# Patient Record
Sex: Female | Born: 1970 | Race: Black or African American | Hispanic: No | Marital: Married | Smoking: Never smoker
Health system: Southern US, Community
[De-identification: ages and names within clinical notes are randomized; demographics above are authoritative.]

## PROBLEM LIST (undated history)

## (undated) DIAGNOSIS — I10 Essential (primary) hypertension: Secondary | ICD-10-CM

---

## 2017-04-12 ENCOUNTER — Ambulatory Visit (INDEPENDENT_AMBULATORY_CARE_PROVIDER_SITE_OTHER): Payer: Medicaid Other | Admitting: Family Medicine

## 2017-04-12 ENCOUNTER — Other Ambulatory Visit (HOSPITAL_COMMUNITY)
Admission: RE | Admit: 2017-04-12 | Discharge: 2017-04-12 | Disposition: A | Discharge status: Home / Self Care | Payer: Medicaid Other | Source: Ambulatory Visit | Attending: Family Medicine | Admitting: Family Medicine

## 2017-04-12 VITALS — BP 122/82 | HR 68 | Temp 98.1°F | Ht 67.5 in | Wt 173.6 lb

## 2017-04-12 DIAGNOSIS — Z0289 Encounter for other administrative examinations: Secondary | ICD-10-CM | POA: Diagnosis not present

## 2017-04-12 DIAGNOSIS — Z789 Other specified health status: Secondary | ICD-10-CM | POA: Diagnosis not present

## 2017-04-12 LAB — POCT URINALYSIS DIP (MANUAL ENTRY)
Bilirubin, UA: NEGATIVE
Glucose, UA: NEGATIVE mg/dL
Leukocytes, UA: NEGATIVE
Nitrite, UA: NEGATIVE
PROTEIN UA: NEGATIVE mg/dL
SPEC GRAV UA: 1.02 (ref 1.010–1.025)
UROBILINOGEN UA: 0.2 U/dL
pH, UA: 5.5 (ref 5.0–8.0)

## 2017-04-12 LAB — POCT UA - MICROSCOPIC ONLY

## 2017-04-12 NOTE — Patient Instructions (Addendum)
Thank you for coming in to see [place] today. Please see below to review our plan for today's visit.  I have placed a referral for you to be seen by the OBGYN.   Please call the clinic at [phone] if your symptoms worsen or you have any concerns. It was my pleasure to see you. -- Durward Parcel, DO [facility], PGY-2

## 2017-04-12 NOTE — Progress Notes (Signed)
Swahili interpreter [facility] utilized during today's visit.  [facility] New Patient Visit  HPI:  Patient presents to [facility] today for a new patient appointment to establish general primary care, also to discuss family planning.  Family planning:  Expecting to be pregnant last month but now having period. She used to take Depo-Provera for contraception in [place]. Has been trying for eight years. Period are regular 28-day cycles lasting 4 days. Does have white vaginal discharge with some itching. ROS: Denies heavy vaginal bleeding, amenorrhea, abdominal pain, pelvic pain.  ROS: Negative otherwise see HPI  Past Medical Hx:  - Hypertension  Past Surgical Hx:  - No  Family Hx: updated in Epic - Number of family members: Husband, 5 sons and 2 daughters - Number of family members in [place]: All live in [place]  Immigrant Social History: - Name spelling correct?: Yes - Date arrived in [place]: 03/30/2017 - Country of origin: [place], came from [place] - Location of refugee camp (if applicable), how long there, and what caused patient to leave home country?:  N/A - Primary language: Swahili  -Requires intepreter (essentially speaks no [place]) - Education: Highest level of education: 5th grade - Prior work: [employer] - Best family contact/phone number [phone] (Mobile), [phone] (Emergency contact, Husband) - Tobacco/alcohol/drug use: no/no/no - Marriage Status: Married - Sexual activity: Yes - Were you beaten or tortured in your country? No  Preventative Care History: -Seen at health department?: No  PHYSICAL EXAM: General: well nourished, well developed, in no acute distress with non-toxic appearance HEENT: normocephalic, atraumatic, moist mucous membranes Neck: supple, non-tender without lymphadenopathy CV: regular rate and rhythm without murmurs, rubs, or gallops Lungs: clear to auscultation bilaterally with normal  work of breathing Abdomen: soft, non-tender, no masses or organomegaly palpable, normoactive bowel sounds Skin: warm, dry, no rashes or lesions, cap refill < 2 seconds Extremities: warm and well perfused, normal tone Psych: euthymic mood, congruent affect  Examined and interviewed with Dr. Gwendolyn Grant

## 2017-04-13 LAB — URINE CYTOLOGY ANCILLARY ONLY
CHLAMYDIA, DNA PROBE: NEGATIVE
NEISSERIA GONORRHEA: NEGATIVE

## 2017-04-13 NOTE — Assessment & Plan Note (Signed)
Patient desires referral to gynecology for discussion on fertility.  Discussed likely entering menopause.  Will only have Medicaid for 8 more months, therefore will place gyn referral today mostly for second opinion and confirmation of difficulties of becoming pregnant

## 2017-04-14 LAB — COMPREHENSIVE METABOLIC PANEL
ALBUMIN: 4.3 g/dL (ref 3.5–5.5)
ALT: 12 IU/L (ref 0–32)
AST: 16 IU/L (ref 0–40)
Albumin/Globulin Ratio: 1.7 (ref 1.2–2.2)
Alkaline Phosphatase: 73 IU/L (ref 39–117)
BUN / CREAT RATIO: 9 (ref 9–23)
BUN: 10 mg/dL (ref 6–24)
CHLORIDE: 107 mmol/L — AB (ref 96–106)
CO2: 22 mmol/L (ref 20–29)
Calcium: 8.6 mg/dL — ABNORMAL LOW (ref 8.7–10.2)
Creatinine, Ser: 1.13 mg/dL — ABNORMAL HIGH (ref 0.57–1.00)
GFR calc non Af Amer: 58 mL/min/{1.73_m2} — ABNORMAL LOW (ref >59)
GFR, EST AFRICAN AMERICAN: 67 mL/min/{1.73_m2} (ref >59)
GLUCOSE: 89 mg/dL (ref 65–99)
Globulin, Total: 2.6 g/dL (ref 1.5–4.5)
Potassium: 3.9 mmol/L (ref 3.5–5.2)
Sodium: 143 mmol/L (ref 134–144)
TOTAL PROTEIN: 6.9 g/dL (ref 6.0–8.5)

## 2017-04-14 LAB — CBC WITH DIFFERENTIAL/PLATELET
BASOS: 0 %
Basophils Absolute: 0 10*3/uL (ref 0.0–0.2)
EOS (ABSOLUTE): 0.2 10*3/uL (ref 0.0–0.4)
EOS: 5 %
HEMATOCRIT: 35.5 % (ref 34.0–46.6)
Hemoglobin: 11 g/dL — ABNORMAL LOW (ref 11.1–15.9)
Immature Grans (Abs): 0 10*3/uL (ref 0.0–0.1)
Immature Granulocytes: 0 %
LYMPHS ABS: 1.9 10*3/uL (ref 0.7–3.1)
Lymphs: 40 %
MCH: 23.2 pg — ABNORMAL LOW (ref 26.6–33.0)
MCHC: 31 g/dL — AB (ref 31.5–35.7)
MCV: 75 fL — AB (ref 79–97)
MONOS ABS: 0.3 10*3/uL (ref 0.1–0.9)
Monocytes: 6 %
Neutrophils Absolute: 2.3 10*3/uL (ref 1.4–7.0)
Neutrophils: 49 %
Platelets: 256 10*3/uL (ref 150–379)
RBC: 4.75 x10E6/uL (ref 3.77–5.28)
RDW: 17.4 % — AB (ref 12.3–15.4)
WBC: 4.8 10*3/uL (ref 3.4–10.8)

## 2017-04-14 LAB — QUANTIFERON TB GOLD ASSAY (BLOOD)

## 2017-04-14 LAB — VARICELLA ZOSTER ANTIBODY, IGG: Varicella zoster IgG: 320 index (ref >165)

## 2017-04-14 LAB — HEPATITIS B SURFACE ANTIGEN: Hepatitis B Surface Ag: NEGATIVE

## 2017-04-14 LAB — RPR: RPR Ser Ql: NONREACTIVE

## 2017-04-14 LAB — SICKLE CELL SCREEN: Sickle Cell Screen: NEGATIVE

## 2017-04-14 LAB — HIV ANTIBODY (ROUTINE TESTING W REFLEX): HIV Screen 4th Generation wRfx: NONREACTIVE

## 2017-04-24 LAB — QUANTIFERON-TB GOLD PLUS
QUANTIFERON NIL VALUE: 0.05 [IU]/mL
QUANTIFERON TB1 AG VALUE: 0.11 [IU]/mL
QuantiFERON Mitogen Value: >10 IU/mL
QuantiFERON TB2 Ag Value: 0.16 IU/mL
QuantiFERON-TB Gold Plus: NEGATIVE

## 2017-04-24 LAB — SPECIMEN STATUS REPORT

## 2017-05-08 NOTE — Progress Notes (Signed)
   Subjective   Patient ID: Monica Li    DOB: 12/28/1970, 46 y.o. female   MRN: [id number]  CC: "High blood pressure"  HPI: Monica Li is a 46 y.o. female who presents to clinic today for the following:  Hypertension: Patient states she was diagnosed 2 years ago with high blood pressure and started on atenolol.  She continue this medication making her transition has refugee here to [place].  She denies history of smoking.  She was instructed to stop the atenolol during her last office visit to reevaluate her BP level.  She does not check her BP level at home.  ROS: see HPI for pertinent.  PMFSH: Refugee. Surgical history unremarkable. Family history unremarkable. Smoking status reviewed. Medications reviewed.  Objective   BP (!) 140/92   Pulse 71   Temp 99.3 F (37.4 C) (Oral)   Ht 5' 7.5" (1.715 m)   Wt 181 lb 6.4 oz (82.3 kg)   LMP 04/27/2017 (Approximate)   SpO2 99%   BMI 27.99 kg/m  Vitals and nursing note reviewed.  General: well nourished, well developed, in no acute distress with non-toxic appearance HEENT: normocephalic, atraumatic, moist mucous membranes Neck: supple, non-tender without lymphadenopathy CV: regular rate and rhythm without murmurs, rubs, or gallops, no lower extremity edema Lungs: clear to auscultation bilaterally with normal work of breathing Skin: warm, dry, no rashes or lesions, cap refill < 2 seconds Extremities: warm and well perfused, normal tone  Assessment & Plan   Primary hypertension Chronic.  Uncontrolled.  Not currently on antihypertensives per my instruction.  Clearly would benefit from medication. -Initiating amlodipine 5 mg daily with instructions to discontinue atenolol -TSH and fasting lipid panel pending  Orders Placed This Encounter  Procedures  . Lipid panel    Standing Status:   Future    Standing Expiration Date:   05/09/2018  . TSH   Meds ordered this encounter  Medications  . amLODipine (NORVASC) 5 MG  tablet    Sig: Take 1 tablet (5 mg total) daily by mouth.    Dispense:  90 tablet    Refill:  3    Durward Parcelavid Pete Merten, DO [facility], PGY-2 05/09/2017 6:56 PM

## 2017-05-09 ENCOUNTER — Encounter: Payer: Self-pay | Admitting: Family Medicine

## 2017-05-09 ENCOUNTER — Ambulatory Visit (INDEPENDENT_AMBULATORY_CARE_PROVIDER_SITE_OTHER): Payer: Medicaid Other | Admitting: Family Medicine

## 2017-05-09 VITALS — BP 140/92 | HR 71 | Temp 99.3°F | Ht 67.5 in | Wt 181.4 lb

## 2017-05-09 DIAGNOSIS — I10 Essential (primary) hypertension: Secondary | ICD-10-CM | POA: Diagnosis not present

## 2017-05-09 MED ORDER — AMLODIPINE BESYLATE 5 MG PO TABS: 5.0000 mg | ORAL_TABLET | Freq: Every day | ORAL | 3 refills | Status: DC

## 2017-05-09 NOTE — Assessment & Plan Note (Addendum)
Chronic.  Uncontrolled.  Not currently on antihypertensives per my instruction.  Clearly would benefit from medication. -Initiating amlodipine 5 mg daily with instructions to discontinue atenolol -TSH and fasting lipid panel pending

## 2017-05-09 NOTE — Patient Instructions (Signed)
Asante kwa kuja ili Guine[place]ona leo. Tafadhali angalia hapa chini ili upate mpango wetu wa ziara ya leo.  1. Shinikizo lako la damu bado linainua. Mimi nitakuanza kwenye dawa mpya inayoitwa amlodipine. Ikiwa unaendeleza uvimbe kwenye miguu yako, tafadhali amama dawa hii. Usifungue atenololi yako. 2. Nitakuita kama kazi yako ya damu ni isiyo Raynelle Charyya kawaida. Vinginevyo wanatarajia kupokea matokeo yako kwa barua pepe. 3. Napenda kuangalia cholesterol yako, hata hivyo tunahitaji wewe kufunga ili kupata maabara haya. Tafadhali ingia kliniki wakati wowote asubuhi wiki hii au wiki ijayo ili kufanya hivyo. 4. Ningependa Catalina Antiguakukuona baada ya wiki 4 kuwa na smear yako ya PAP inafanywa.  Jeannette Howafadhali piga kliniki [place] Hopekwenye [phone] ikiwa dalili zako zinazidi kuwa mbaya au una matatizo yoyote. Nilifurahi Belaruskukuona. Durward Parcel- David McMullen, DO Dawa ya Madawa ya Rodman PickleFamilia ya Afya, PGY-2  Thank you for coming in to see us today. Please see below to review our plan for today's visit.  1.  Your blood pressure is still elevated.  I will start you on a new medication called amlodipine.  If you develop swelling on your legs, please stop this medication.  Do not restart your atenolol. 2.  I will call you if your blood work is abnormal.  Otherwise expect to receive your results in the mail. 3.  I would like to check your cholesterol, however we need you to fast in order to get this lab.  Please come in to the clinic at any point in the morning this week or next week to have this performed. 4.  I would like to see you in 1 month to have your Pap smear performed.  Please call the clinic at [phone] if your symptoms worsen or you have any concerns. It was my pleasure to see you. -- Durward Parcelavid McMullen, DO [facility], PGY-2

## 2017-05-10 LAB — TSH: TSH: 0.622 u[IU]/mL (ref 0.450–4.500)

## 2017-07-19 ENCOUNTER — Other Ambulatory Visit: Payer: Self-pay

## 2017-07-19 ENCOUNTER — Ambulatory Visit: Payer: Medicaid Other | Admitting: Family Medicine

## 2017-07-19 ENCOUNTER — Telehealth: Payer: Self-pay | Admitting: Family Medicine

## 2017-07-19 ENCOUNTER — Encounter: Payer: Self-pay | Admitting: Family Medicine

## 2017-07-19 VITALS — BP 164/102 | HR 73 | Temp 98.4°F | Ht 67.5 in | Wt 197.4 lb

## 2017-07-19 DIAGNOSIS — L700 Acne vulgaris: Secondary | ICD-10-CM

## 2017-07-19 DIAGNOSIS — I10 Essential (primary) hypertension: Secondary | ICD-10-CM

## 2017-07-19 DIAGNOSIS — L709 Acne, unspecified: Secondary | ICD-10-CM | POA: Insufficient documentation

## 2017-07-19 MED ORDER — BENZOYL PEROXIDE 5 % EX LIQD: Freq: Two times a day (BID) | CUTANEOUS | 3 refills | Status: DC

## 2017-07-19 NOTE — Assessment & Plan Note (Addendum)
Chronic. Uncontrolled. Patient did not refill and has been off med for 2 weeks. Non-smoker. - Patient to go with husband to pick up refill of amlodipine 5 mg daily, used teach-back method - Needs fasting lipid, patient to come to clinic next week and also will get BP checked

## 2017-07-19 NOTE — Assessment & Plan Note (Addendum)
Chronic. Affecting face only. Does not appear to be inflammatory or cystic. May be exacerbated by onset of menopause. - Trial Benzoyl Peroxide twice daily

## 2017-07-19 NOTE — Telephone Encounter (Signed)
Pt was seen today and forgot to tell the doctor where to send her BP medication. She will be using the [facility] on [address]. jw

## 2017-07-19 NOTE — Progress Notes (Signed)
   Subjective   Patient ID: Monica Li    DOB: 11/18/1970, 47 y.o. female   MRN: [id number] [employer] interpreting service used: Roseanne RenoHassan #[id number] Albin Fischer(Swahili)  CC: "High blood pressure"  HPI: Monica Anonsperance Rashad is a 47 y.o. female who presents to clinic today for the following:  Hypertension: Patient was taking amlodipine daily until she ran out approximately 2 weeks ago.  She does not check her blood pressure while at home.  She was not aware that she needed to go get a refill.  Acne: Patient has been experiencing facial rash with bumps on forehead and cheeks.  This is been an ongoing issue for many years which has been worse more recently.  She denies any new soaps or detergents.  ROS: see HPI for pertinent.  PMFSH: HTN, refugee. Surgical history unremarkable. Family history unremarkable. Smoking status reviewed. Medications reviewed.  Objective   BP (!) 164/102   Pulse 73   Temp 98.4 F (36.9 C) (Oral)   Ht 5' 7.5" (1.715 m)   Wt 197 lb 6.4 oz (89.5 kg)   SpO2 99%   BMI 30.46 kg/m  Vitals and nursing note reviewed.  General: well nourished, well developed, NAD with non-toxic appearance HEENT: normocephalic, atraumatic, moist mucous membranes Neck: supple, non-tender without lymphadenopathy Cardiovascular: regular rate and rhythm without murmurs, rubs, or gallops Lungs: clear to auscultation bilaterally with normal work of breathing Skin: warm, dry, cap refill < 2 seconds, several comedones on forehead and diffusely across face consistent with acne without signs of erythema or cysts Extremities: warm and well perfused, normal tone, no edema  Assessment & Plan   Acne Chronic. Affecting face only. Does not appear to be inflammatory or cystic. May be exacerbated by onset of menopause. - Trial Benzoyl Peroxide twice daily  Primary hypertension Chronic. Uncontrolled. Patient did not refill and has been off med for 2 weeks. Non-smoker. - Patient to go with husband to pick up  refill of amlodipine 5 mg daily, used teach-back method - Needs fasting lipid, patient to come to clinic next week and also will get BP checked  No orders of the defined types were placed in this encounter.  Meds ordered this encounter  Medications  . benzoyl peroxide (BENZOYL PEROXIDE) 5 % external liquid    Sig: Apply topically 2 (two) times daily.    Dispense:  140 g    Refill:  3    Durward Parcelavid McMullen, DO [facility], PGY-2 07/19/2017, 8:12 PM

## 2017-07-19 NOTE — Patient Instructions (Addendum)
Asante kwa kuja ili Gu[place]ona leo. Tafadhali angalia hapa chini ili upate mpango wetu wa ziara ya leo. 1. Nenda na Sloan Leitermume wako kwenye Buena Vistamaduka ya dawa ili kuchukua dawa yako ya shinikizo la damu. Chukua hii kila siku. Lavonna MonarchIkiwa ukienda nje, kurudi kwenye maduka ya dawa. 2. Nataka wewe Bearl Mulberrykuja kliniki asubuhi kabla ya kula au kunywa wakati mwingine wiki ijayo ili uhakiki wa shinikizo la damu na hundi ya cholesterol. 3. Neomia DearUna moyo unung'unika ambao sio jambo lisilo la Pointe a la Hachekawaida sasa hivi. Ni taarifa tu kwa wewe kujua. 4. Una acne. Nimekupa peroxide ya benzoyl kwa uso wako. Omba uso mara mbili kila siku. Inaweza kuchoma awali wakati unatumia. Alesia Morinafadhali piga kliniki kwenye [phone] ikiwa dalili zako zinazidi Western Saharakuwa mbaya au una matatizo yoyote. Ilikuwa radhi yetu kukutumikia. Durward Parcelavid McMullen, DO Dawa ya Madawa ya Rodman PickleFamilia ya Afya, PGY-2     Thank you for coming in to see us today. Please see below to review our plan for today's visit.  1. Go with your husband to the pharmacy to pick up your blood pressure medication. Take this daily. If you run out, return to the pharmacy. 2. I want you to come to the clinic in the morning before eating or drinking sometime next week to have your blood pressure checked and cholesterol check. 3. You have a heart murmur which is not anything abnormal right now. It is just information for you to know. 4. You have acne. I have given you benzoyl peroxide for your face. Apply to face twice daily. It may burn initially when using.    Please call the clinic at [phone] if your symptoms worsen or you have any concerns. It was our pleasure to serve you.  Durward Parcelavid McMullen, DO [facility], PGY-2

## 2017-09-13 ENCOUNTER — Encounter (HOSPITAL_COMMUNITY): Payer: Self-pay | Admitting: Emergency Medicine

## 2017-09-13 ENCOUNTER — Ambulatory Visit (HOSPITAL_COMMUNITY)
Admission: EM | Admit: 2017-09-13 | Discharge: 2017-09-13 | Disposition: A | Discharge status: Home / Self Care | Payer: Medicaid Other | Attending: Family Medicine | Admitting: Family Medicine

## 2017-09-13 ENCOUNTER — Other Ambulatory Visit: Payer: Self-pay

## 2017-09-13 DIAGNOSIS — M79642 Pain in left hand: Secondary | ICD-10-CM

## 2017-09-13 DIAGNOSIS — I10 Essential (primary) hypertension: Secondary | ICD-10-CM | POA: Diagnosis not present

## 2017-09-13 DIAGNOSIS — Z3202 Encounter for pregnancy test, result negative: Secondary | ICD-10-CM | POA: Diagnosis not present

## 2017-09-13 DIAGNOSIS — M79641 Pain in right hand: Secondary | ICD-10-CM

## 2017-09-13 HISTORY — DX: Essential (primary) hypertension: I10

## 2017-09-13 LAB — POCT PREGNANCY, URINE: Preg Test, Ur: NEGATIVE

## 2017-09-13 MED ORDER — AMLODIPINE BESYLATE 5 MG PO TABS: 5.0000 mg | ORAL_TABLET | Freq: Every day | ORAL | 3 refills | Status: DC

## 2017-09-13 MED ORDER — DICLOFENAC SODIUM 75 MG PO TBEC: 75.0000 mg | DELAYED_RELEASE_TABLET | Freq: Two times a day (BID) | ORAL | 0 refills | Status: DC

## 2017-09-13 NOTE — ED Triage Notes (Signed)
Ran out of blood pressure medicine one week ago

## 2017-09-14 ENCOUNTER — Encounter: Payer: Self-pay | Admitting: Internal Medicine

## 2017-09-14 ENCOUNTER — Ambulatory Visit (INDEPENDENT_AMBULATORY_CARE_PROVIDER_SITE_OTHER): Payer: Medicaid Other | Admitting: Internal Medicine

## 2017-09-14 ENCOUNTER — Other Ambulatory Visit: Payer: Self-pay

## 2017-09-14 VITALS — BP 140/98 | HR 74 | Temp 98.2°F | Wt 198.0 lb

## 2017-09-14 DIAGNOSIS — G44209 Tension-type headache, unspecified, not intractable: Secondary | ICD-10-CM

## 2017-09-14 DIAGNOSIS — I10 Essential (primary) hypertension: Secondary | ICD-10-CM

## 2017-09-14 NOTE — Progress Notes (Addendum)
   [facility] Phone: [phone]   Date of Visit: 09/14/2017   HPI:  HTN: - reports of being out of Norvasc 5mg  daily for about 1 week. She was seen at urgent care yesterday to get a refill and started taking this again.  - she denies any chest pain or shortness of breath - she does not check her blood pressure at home  Headache:  - reports of daily headache that started this month - she started a new job in December and works from 2pm to 4am  - her headache is usually when she comes home from work. It improves after she rests. Describes as a throbbing headache. Does not bother her during sleep - denies photophobia, phonophobia, or nausea.  - reports that it is difficult for her to go to sleep because of her work schedule and also trying to balance taking care of her children. Her husband does help out with this.  - using Tylenol which does help with symptoms   ROS: See HPI.  PMFSH:  HTN  PHYSICAL EXAM: BP (!) 140/98   Pulse 74   Temp 98.2 F (36.8 C) (Oral)   Wt 198 lb (89.8 kg)   LMP 09/12/2017   SpO2 99%   BMI 30.55 kg/m  GEN: NAD HEENT: Atraumatic, normocephalic, neck supple, EOMI, sclera clear  CV: RRR, no murmurs, rubs, or gallops PULM: CTAB, normal effort SKIN: No rash or cyanosis; warm and well-perfused EXTR: No lower extremity edema or calf tenderness PSYCH: Mood and affect euthymic, normal rate and volume of speech NEURO: Awake, alert, CN 2-12 normal, normal strength in upper and lower extremities bilaterally, normal sensation to light touch in upper and lower extremities. Normal patellar reflexes, normal speech   ASSESSMENT/PLAN:   Primary hypertension BP today is not terribly elevated. She had been out of her medication for a week and just restarted yesterday. Therefore will not make any changes to her medication. Continue Norvasc 5mg  daily. Will obtain BMP to check Cr as this was slightly elevated when last checked.   Tension  Headache:  Her symptoms seem most consistent with tension headache. Unlikely due to a secondary headache. No red flags and neurological exam is normal. Can use Tylenol PRN. Follow up in 2-3 weeks to see how things are going.    Palma HolterKanishka G Latria Mccarron, MD PGY 3 [facility]

## 2017-09-14 NOTE — Patient Instructions (Addendum)
1. For your blood pressure, please continue your blood pressure medication for now: Amlodipine 5mg  daily. Follow up in 2-3 weeks  2. Continue Tylenol as needed for your headache

## 2017-09-15 ENCOUNTER — Encounter: Payer: Self-pay | Admitting: Internal Medicine

## 2017-09-15 LAB — BASIC METABOLIC PANEL
BUN / CREAT RATIO: 12 (ref 9–23)
BUN: 8 mg/dL (ref 6–24)
CHLORIDE: 104 mmol/L (ref 96–106)
CO2: 25 mmol/L (ref 20–29)
Calcium: 9.4 mg/dL (ref 8.7–10.2)
Creatinine, Ser: 0.66 mg/dL (ref 0.57–1.00)
GFR calc non Af Amer: 105 mL/min/{1.73_m2} (ref >59)
GFR, EST AFRICAN AMERICAN: 121 mL/min/{1.73_m2} (ref >59)
Glucose: 95 mg/dL (ref 65–99)
POTASSIUM: 3.6 mmol/L (ref 3.5–5.2)
SODIUM: 144 mmol/L (ref 134–144)

## 2017-09-15 NOTE — Assessment & Plan Note (Signed)
BP today is not terribly elevated. She had been out of her medication for a week and just restarted yesterday. Therefore will not make any changes to her medication. Continue Norvasc 5mg  daily. Will obtain BMP to check Cr as this was slightly elevated when last checked.

## 2017-09-22 NOTE — ED Provider Notes (Signed)
[facility]   [id number] 09/13/17 Arrival Time: 1522  ASSESSMENT & PLAN:  1. Essential hypertension   2. Primary hypertension   3. Bilateral hand pain     Meds ordered this encounter  Medications  . amLODipine (NORVASC) 5 MG tablet    Sig: Take 1 tablet (5 mg total) by mouth daily.    Dispense:  90 tablet    Refill:  3  . diclofenac (VOLTAREN) 75 MG EC tablet    Sig: Take 1 tablet (75 mg total) by mouth 2 (two) times daily.    Dispense:  20 tablet    Refill:  0    Will f/u PCP. May f/u here as needed.  Will follow up with PCP or here if worsening or failing to improve as anticipated. Reviewed expectations re: course of current medical issues. Questions answered. Outlined signs and symptoms indicating need for more acute intervention. Patient verbalized understanding. After Visit Summary given.   SUBJECTIVE:  Monica Li is a 47 y.o. female who presents with concerns regarding increased blood pressures. She reports running out of BP meds one week ago. Has not measured since. Requests refill of amlodipine. She reports taking medications as instructed, no medication side effects noted, no chest pain on exertion, no dyspnea on exertion and no swelling of ankles.  Also reports intermittent bilateral hand discomfort. Worse at end of day. Uses hands frequently. No injury/trauma. "Feel very sore and stiff" at times. No OTC treatment. No extremity sensation changes or weakness.   Also requests UPT.  Social History   Tobacco Use  Smoking Status Never Smoker  Smokeless Tobacco Never Used    ROS: As per HPI.   OBJECTIVE:  Vitals:   09/13/17 1716  BP: (!) 138/91  Pulse: 78  Resp: 18  Temp: 98.6 F (37 C)  TempSrc: Oral  SpO2: 100%    General appearance: alert; no distress Neck: supple Lungs: clear to auscultation bilaterally Heart: regular rate and rhythm without murmer Extremities: no cyanosis or edema; symmetrical with no gross deformities;  hand exam normal bilaterally Skin: warm and dry Psychological: alert and cooperative; normal mood and affect   Labs: Results for orders placed or performed during the hospital encounter of 09/13/17  Pregnancy, urine POC  Result Value Ref Range   Preg Test, Ur NEGATIVE NEGATIVE   Labs Reviewed  POCT PREGNANCY, URINE    No Known Allergies  Past Medical History:  Diagnosis Date  . Hypertension    Social History   Socioeconomic History  . Marital status: Married    Spouse name: Not on file  . Number of children: Not on file  . Years of education: Not on file  . Highest education level: Not on file  Occupational History  . Not on file  Social Needs  . Financial resource strain: Not on file  . Food insecurity:    Worry: Not on file    Inability: Not on file  . Transportation needs:    Medical: Not on file    Non-medical: Not on file  Tobacco Use  . Smoking status: Never Smoker  . Smokeless tobacco: Never Used  Substance and Sexual Activity  . Alcohol use: No    Frequency: Never  . Drug use: No  . Sexual activity: Not on file  Lifestyle  . Physical activity:    Days per week: Not on file    Minutes per session: Not on file  . Stress: Not on file  Relationships  . Social  connections:    Talks on phone: Not on file    Gets together: Not on file    Attends religious service: Not on file    Active member of club or organization: Not on file    Attends meetings of clubs or organizations: Not on file    Relationship status: Not on file  . Intimate partner violence:    Fear of current or ex partner: Not on file    Emotionally abused: Not on file    Physically abused: Not on file    Forced sexual activity: Not on file  Other Topics Concern  . Not on file  Social History Narrative  . Not on file    History reviewed. No pertinent surgical history.     Mardella Layman, MD 09/22/17 [phone]

## 2017-10-17 ENCOUNTER — Ambulatory Visit: Payer: Medicaid Other | Admitting: Family Medicine

## 2017-10-18 ENCOUNTER — Ambulatory Visit: Payer: Medicaid Other | Admitting: Student

## 2017-10-20 ENCOUNTER — Encounter: Payer: Self-pay | Admitting: Internal Medicine

## 2017-10-20 ENCOUNTER — Other Ambulatory Visit: Payer: Self-pay

## 2017-10-20 ENCOUNTER — Ambulatory Visit: Payer: Medicaid Other | Admitting: Internal Medicine

## 2017-10-20 VITALS — BP 150/98 | HR 82 | Temp 98.5°F | Ht 67.5 in | Wt 205.0 lb

## 2017-10-20 DIAGNOSIS — N979 Female infertility, unspecified: Secondary | ICD-10-CM

## 2017-10-20 DIAGNOSIS — D649 Anemia, unspecified: Secondary | ICD-10-CM | POA: Diagnosis not present

## 2017-10-20 DIAGNOSIS — R6889 Other general symptoms and signs: Secondary | ICD-10-CM | POA: Diagnosis not present

## 2017-10-20 DIAGNOSIS — Z3202 Encounter for pregnancy test, result negative: Secondary | ICD-10-CM

## 2017-10-20 DIAGNOSIS — R1030 Lower abdominal pain, unspecified: Secondary | ICD-10-CM

## 2017-10-20 DIAGNOSIS — I10 Essential (primary) hypertension: Secondary | ICD-10-CM

## 2017-10-20 LAB — POCT UA - MICROSCOPIC ONLY

## 2017-10-20 LAB — POCT URINALYSIS DIP (MANUAL ENTRY)
BILIRUBIN UA: NEGATIVE mg/dL
Bilirubin, UA: NEGATIVE
Glucose, UA: NEGATIVE mg/dL
Leukocytes, UA: NEGATIVE
Nitrite, UA: NEGATIVE
PH UA: 5.5 (ref 5.0–8.0)
PROTEIN UA: NEGATIVE mg/dL
Urobilinogen, UA: 0.2 E.U./dL

## 2017-10-20 LAB — POCT URINE PREGNANCY: Preg Test, Ur: NEGATIVE

## 2017-10-20 MED ORDER — AMLODIPINE BESYLATE 5 MG PO TABS: 5.0000 mg | ORAL_TABLET | Freq: Every day | ORAL | 1 refills | Status: DC

## 2017-10-20 MED ORDER — AMLODIPINE BESYLATE 5 MG PO TABS: 5.0000 mg | ORAL_TABLET | Freq: Every day | ORAL | 3 refills | Status: DC

## 2017-10-20 MED ORDER — OLOPATADINE HCL 0.2 % OP SOLN: 1.0000 [drp] | Freq: Every day | OPHTHALMIC | 1 refills | Status: DC | PRN

## 2017-10-20 NOTE — Patient Instructions (Signed)
Thank you for coming in today.  I ordered pataday allergy drops for your eyes to use once daily as needed.  Continue amlodipine for blood pressure.  I will refer you to gynecology for family planning.  I am checking for urinary tract infection and elevated cholesterol and anemia.  Please return in 1 month for blood pressure recheck.   Best, Dr. Sampson GoonFitzgerald

## 2017-10-21 LAB — CBC
HEMATOCRIT: 37.4 % (ref 34.0–46.6)
HEMOGLOBIN: 11.4 g/dL (ref 11.1–15.9)
MCH: 22.3 pg — AB (ref 26.6–33.0)
MCHC: 30.5 g/dL — AB (ref 31.5–35.7)
MCV: 73 fL — AB (ref 79–97)
Platelets: 266 10*3/uL (ref 150–379)
RBC: 5.12 x10E6/uL (ref 3.77–5.28)
RDW: 17.1 % — AB (ref 12.3–15.4)
WBC: 5.8 10*3/uL (ref 3.4–10.8)

## 2017-10-21 LAB — LIPID PANEL
CHOL/HDL RATIO: 2.8 ratio (ref 0.0–4.4)
CHOLESTEROL TOTAL: 140 mg/dL (ref 100–199)
HDL: 50 mg/dL (ref >39)
LDL CALC: 71 mg/dL (ref 0–99)
Triglycerides: 96 mg/dL (ref 0–149)
VLDL CHOLESTEROL CAL: 19 mg/dL (ref 5–40)

## 2017-10-21 LAB — FERRITIN: Ferritin: 18 ng/mL (ref 15–150)

## 2017-10-24 ENCOUNTER — Encounter: Payer: Self-pay | Admitting: Internal Medicine

## 2017-10-24 DIAGNOSIS — R1030 Lower abdominal pain, unspecified: Secondary | ICD-10-CM | POA: Insufficient documentation

## 2017-10-24 DIAGNOSIS — R6889 Other general symptoms and signs: Secondary | ICD-10-CM | POA: Insufficient documentation

## 2017-10-24 NOTE — Assessment & Plan Note (Signed)
-   Ordered pataday eye drops for suspected allergic conjunctivitis. No foreign body sensation or known trauma to suggest corneal abrasion.

## 2017-10-24 NOTE — Assessment & Plan Note (Signed)
-   Not at goal today of < 140/90 in setting of not taking medication. - Provided 90 day refill of amlodipine in hopes of improving adherence - Will screen for HLD with lipid panel given obesity and HTN

## 2017-10-24 NOTE — Progress Notes (Signed)
[facility]  Subjective:  Monica Li is a 47 y.o. female with history of HTN who presents for follow-up of BP and headaches. Patient is accompanied by her husband who speaks some [place]. [facility] Abrahim [phone]). Patient denies headaches and reports belly pain and eye itching. She has also been out of norvasc for about 2 weeks.   #HTN: - Has been out of amlodipine 5 mg, thought she had to have visit in order to get refill - Does note some swelling of ankles but says stands a lot for work ROS: No chest pain, no more headaches  #Itchy eyes: - Reports this used to happen when she lived in [place] when windy and improved with eye drops - Denies sensation of foreign body - Denies injury to eye ROS: No acute vision changes (but has had some blurriness when trying to read up close)  #Abdominal pain: - Has been having menstrual period for last 4 days and having lower abdominal pain; sometimes has menstrual cramps - has not taken anything for pain - Reports some increased urinary frequency but no burning - reports wanting to get pregnant ROS: No fevers, no n/v/d, no back pain, no vaginal discharge   No Known Allergies  Social History   Tobacco Use  . Smoking status: Never Smoker  . Smokeless tobacco: Never Used  Substance Use Topics  . Alcohol use: No    Frequency: Never    Objective: Blood pressure (!) 150/98, pulse 82, temperature 98.5 F (36.9 C), temperature source Oral, height 5' 7.5" (1.715 m), weight 205 lb (93 kg), last menstrual period 10/10/2017, SpO2 98 %. Body mass index is 31.63 kg/m. Has been out of blood pressure medication.  Constitutional: Obese female, pleasant in NAD HENT: Bilateral pingueculae at 9 o'clock, mild erythema of conjunctiva Cardiovascular: RRR, S1, S2, no m/r/g.  Pulmonary/Chest: Effort normal and breath sounds normal.  Abdominal: Soft. +BS, mild TTP over lower  abdomen Musculoskeletal: No CVA tenderness Neurological: AOx3, no focal deficits. Vitals reviewed  Assessment/Plan: Lower abdominal pain - In setting of regular menstrual cycle. Suspect menstrual cramps as pain comes and goes and is not focal. No CVA tenderness or fever to suggest pyelonephritis.  - will check UA, u preg for UTI and ectopic (trying to get pregnant) - will obtain CBC with ferritin as follow-up history of microcytic anemia  Primary hypertension - Not at goal today of < 140/90 in setting of not taking medication. - Provided 90 day refill of amlodipine in hopes of improving adherence - Will screen for HLD with lipid panel given obesity and HTN  Itchy eyes - Ordered pataday eye drops for suspected allergic conjunctivitis. No foreign body sensation or known trauma to suggest corneal abrasion.   Will refer to gynecology per her preference for discussion of fertility, though explained [facility] also [facility] and that her age makes becoming pregnant more difficult.   Follow-up in month to monitor blood pressure control.  Dani GobbleHillary Daruis Swaim, MD [facility], PGY-3

## 2017-10-24 NOTE — Assessment & Plan Note (Signed)
-   In setting of regular menstrual cycle. Suspect menstrual cramps as pain comes and goes and is not focal. No CVA tenderness or fever to suggest pyelonephritis.  - will check UA, u preg for UTI and ectopic (trying to get pregnant) - will obtain CBC with ferritin as follow-up history of microcytic anemia

## 2017-10-26 ENCOUNTER — Encounter: Payer: Self-pay | Admitting: Internal Medicine

## 2018-03-02 ENCOUNTER — Ambulatory Visit: Payer: Medicaid Other | Admitting: Family Medicine

## 2018-03-27 ENCOUNTER — Telehealth: Payer: Self-pay | Admitting: Family Medicine

## 2018-03-27 NOTE — Telephone Encounter (Signed)
Pt husband called to schedule his wife's next appointment with Dr. Abelardo DieselMcmullen. His next appointment is not until 05/10/18. He would like to know if she can have enough blood pressure medicine sent to the pharmacy to last her until that appointment.

## 2018-03-28 ENCOUNTER — Other Ambulatory Visit: Payer: Self-pay | Admitting: Family Medicine

## 2018-03-28 DIAGNOSIS — I10 Essential (primary) hypertension: Secondary | ICD-10-CM

## 2018-03-28 MED ORDER — AMLODIPINE BESYLATE 5 MG PO TABS: 5.0000 mg | ORAL_TABLET | Freq: Every day | ORAL | 0 refills | Status: DC

## 2018-03-28 NOTE — Telephone Encounter (Signed)
Rx sent. Please advise.  David McMullen, DO [facility], PGY-3  

## 2018-03-30 NOTE — Telephone Encounter (Signed)
Called pt using swahili interpretor (rosemary [phone]) and informed her that her bp medication was sent to the pharmacy. Pt wanted a nurse visit to check her bp, appt made. Deseree Bruna Potter, CMA

## 2018-04-04 ENCOUNTER — Ambulatory Visit: Payer: Medicaid Other

## 2018-05-10 ENCOUNTER — Ambulatory Visit: Payer: Medicaid Other | Admitting: Family Medicine

## 2018-05-29 ENCOUNTER — Ambulatory Visit: Payer: Medicaid Other

## 2018-05-30 NOTE — Progress Notes (Deleted)
   Subjective   Patient ID: Monica Li    DOB: February 28, 1971, 47 y.o. female   MRN: [id number]  CC: "Blood pressure medication"  HPI: Monica Li is a 47 y.o. female who presents to clinic today for the following:  Hypertension: ***  ROS: see HPI for pertinent.  PMFSH: HTN, acne. Smoking status reviewed. Medications reviewed.  Objective   There were no vitals taken for this visit. Vitals and nursing note reviewed.  General: well nourished, well developed, NAD with non-toxic appearance HEENT: normocephalic, atraumatic, moist mucous membranes Neck: supple, non-tender without lymphadenopathy Cardiovascular: regular rate and rhythm without murmurs, rubs, or gallops Lungs: clear to auscultation bilaterally with normal work of breathing Abdomen: soft, non-tender, non-distended, normoactive bowel sounds Skin: warm, dry, no rashes or lesions, cap refill < 2 seconds Extremities: warm and well perfused, normal tone, no edema  Assessment & Plan   No problem-specific Assessment & Plan notes found for this encounter.  No orders of the defined types were placed in this encounter.  No orders of the defined types were placed in this encounter.   Durward Parcelavid Daud Cayer, DO [facility], PGY-3 05/30/2018, 9:13 AM

## 2018-05-31 ENCOUNTER — Telehealth: Payer: Self-pay | Admitting: Family Medicine

## 2018-05-31 ENCOUNTER — Ambulatory Visit: Payer: Medicaid Other | Admitting: Family Medicine

## 2018-05-31 NOTE — Telephone Encounter (Signed)
I called her with no response. Pacific interpreter ID#: [id number]. HIPAA compliant callback message left.  Dr. Abelardo DieselMcMullen,   Please go ahead and send her a No-Show to two appointment letter via certified mail. Let her know that she will be dismissed from the practice with subsequent No-Show.  Keep in mind that communication might be the major problem here, I.e language barrier.   Thanks.

## 2018-05-31 NOTE — Telephone Encounter (Signed)
Patient no showed during continuity clinic today.  Attempted to call, left voicemail requesting patient to call back clinic regarding missed appointment.    Patient has missed the following appointments: 05/31/2018 no-show 05/10/2018 canceled 04/04/2018 no-show 03/02/2018 no showed 10/18/2017 canceled 10/17/2017 canceled  Durward Parcelavid McMullen, DO [facility], PGY-3

## 2018-06-05 ENCOUNTER — Encounter: Payer: Self-pay | Admitting: Family Medicine

## 2018-06-26 ENCOUNTER — Other Ambulatory Visit: Payer: Self-pay

## 2018-06-26 ENCOUNTER — Encounter (HOSPITAL_COMMUNITY): Payer: Self-pay | Admitting: Emergency Medicine

## 2018-06-26 ENCOUNTER — Ambulatory Visit (HOSPITAL_COMMUNITY)
Admission: EM | Admit: 2018-06-26 | Discharge: 2018-06-26 | Disposition: A | Discharge status: Home / Self Care | Payer: BLUE CROSS/BLUE SHIELD | Attending: Family Medicine | Admitting: Family Medicine

## 2018-06-26 DIAGNOSIS — Z760 Encounter for issue of repeat prescription: Secondary | ICD-10-CM | POA: Insufficient documentation

## 2018-06-26 DIAGNOSIS — R42 Dizziness and giddiness: Secondary | ICD-10-CM | POA: Diagnosis not present

## 2018-06-26 DIAGNOSIS — I10 Essential (primary) hypertension: Secondary | ICD-10-CM | POA: Diagnosis not present

## 2018-06-26 DIAGNOSIS — R51 Headache: Secondary | ICD-10-CM

## 2018-06-26 MED ORDER — ONDANSETRON 4 MG PO TBDP: 4.0000 mg | ORAL_TABLET | Freq: Three times a day (TID) | ORAL | 0 refills | Status: DC | PRN

## 2018-06-26 MED ORDER — AMLODIPINE BESYLATE 5 MG PO TABS: 5.0000 mg | ORAL_TABLET | Freq: Every day | ORAL | 0 refills | Status: DC

## 2018-06-26 NOTE — Discharge Instructions (Addendum)
I have refilled your amlodipine Please take one daily Monitor your blood pressure and for resolution of other symptoms Please return if blood pressure remaining elevated, developing worsening headache, vision changes, chest pain or shortness of breath  Use zofran under the tongue 30 minutes before meals to help with nausea.  Drink plenty of fluids and try to eat normal amount of solids Follow up if symptoms persisting, worsening, developing abdominal pain  Please follow up with your primary doctor for further blood pressure management/refills, further workup of decreased appetite

## 2018-06-26 NOTE — ED Triage Notes (Signed)
Pt has a history of HBP but is not on any medications.  Pt was at work and had a headache and they took her BP and her systolic was 170.

## 2018-06-26 NOTE — ED Provider Notes (Signed)
[facility]    CSN: [id number] Arrival date & time: 06/26/18  [id number]     History   Chief Complaint Chief Complaint  Patient presents with  . Hypertension    HPI Swahili interpretation via [employer] interpreter Monica Li is a 47 y.o. female history of hypertension presenting today for evaluation of elevated blood pressure.  Patient states that when she was at work 1 week ago she had her blood pressure taken because she was having a headache.  Blood pressure was around 170 systolic.  She has been out of her blood pressure medicines for approximately 2 weeks.  Over the past week she has had occasional headaches with associated dizziness most standing.  She denies sensation of syncope.  She feels her heart pounding.  Denies chest pain or shortness of breath.  She states that she has had decreased appetite, but maintaining normal fluid intake.  She has nausea after eating.  Denies abdominal pain.  Denies change in bowel movements.  HPI  Past Medical History:  Diagnosis Date  . Hypertension     Patient Active Problem List   Diagnosis Date Noted  . Lower abdominal pain 10/24/2017  . Itchy eyes 10/24/2017  . Acne 07/19/2017  . Primary hypertension 05/09/2017  . Language barrier 04/12/2017    History reviewed. No pertinent surgical history.  OB History   No obstetric history on file.      Home Medications    Prior to Admission medications   Medication Sig Start Date End Date Taking? Authorizing Provider  amLODipine (NORVASC) 5 MG tablet Take 1 tablet (5 mg total) by mouth daily. 06/26/18   Wieters, Hallie C, PA-C  benzoyl peroxide (BENZOYL PEROXIDE) 5 % external liquid Apply topically 2 (two) times daily. 07/19/17   Wendee BeaversMcMullen, David J, DO  diclofenac (VOLTAREN) 75 MG EC tablet Take 1 tablet (75 mg total) by mouth 2 (two) times daily. 09/13/17   Mardella LaymanHagler, Brian, MD  Olopatadine HCl 0.2 % SOLN Apply 1 drop to eye daily as needed. 10/20/17   Casey BurkittFitzgerald, Hillary Moen,  MD  ondansetron (ZOFRAN ODT) 4 MG disintegrating tablet Take 1 tablet (4 mg total) by mouth every 8 (eight) hours as needed for nausea or vomiting. 06/26/18   Wieters, Junius CreamerHallie C, PA-C    Family History History reviewed. No pertinent family history.  Social History Social History   Tobacco Use  . Smoking status: Never Smoker  . Smokeless tobacco: Never Used  Substance Use Topics  . Alcohol use: No    Frequency: Never  . Drug use: No     Allergies   Patient has no known allergies.   Review of Systems Review of Systems  Constitutional: Negative for fatigue and fever.  HENT: Negative for congestion, sinus pressure and sore throat.   Eyes: Negative for photophobia, pain and visual disturbance.  Respiratory: Negative for cough and shortness of breath.   Cardiovascular: Positive for palpitations. Negative for chest pain and leg swelling.  Gastrointestinal: Positive for nausea. Negative for abdominal pain and vomiting.  Genitourinary: Negative for decreased urine volume and hematuria.  Musculoskeletal: Negative for myalgias, neck pain and neck stiffness.  Neurological: Positive for dizziness and headaches. Negative for syncope, facial asymmetry, speech difficulty, weakness, light-headedness and numbness.     Physical Exam Triage Vital Signs ED Triage Vitals  Enc Vitals Group     BP 06/26/18 1011 (!) 162/107     Pulse Rate 06/26/18 1011 73     Resp 06/26/18 1011 18  Temp 06/26/18 1011 98.2 F (36.8 C)     Temp Source 06/26/18 1011 Oral     SpO2 06/26/18 1011 100 %     Weight --      Height --      Head Circumference --      Peak Flow --      Pain Score 06/26/18 1013 0     Pain Loc --      Pain Edu? --      Excl. in GC? --    No data found.  Updated Vital Signs BP (!) 162/107 (BP Location: Right Arm)   Pulse 73   Temp 98.2 F (36.8 C) (Oral)   Resp 18   LMP 06/12/2018 (Approximate)   SpO2 100%  Blood pressure rechecked prior to discharge 149/98 Visual  Acuity Right Eye Distance:   Left Eye Distance:   Bilateral Distance:    Right Eye Near:   Left Eye Near:    Bilateral Near:     Physical Exam Vitals signs and nursing note reviewed.  Constitutional:      General: She is not in acute distress.    Appearance: She is well-developed.  HENT:     Head: Normocephalic and atraumatic.     Right Ear: Tympanic membrane normal.     Left Ear: Tympanic membrane normal.     Mouth/Throat:     Comments: Oral mucosa pink and moist, no tonsillar enlargement or exudate. Posterior pharynx patent and nonerythematous, no uvula deviation or swelling. Normal phonation. Eyes:     Extraocular Movements: Extraocular movements intact.     Conjunctiva/sclera: Conjunctivae normal.     Pupils: Pupils are equal, round, and reactive to light.  Neck:     Musculoskeletal: Neck supple.  Cardiovascular:     Rate and Rhythm: Normal rate and regular rhythm.     Heart sounds: No murmur.  Pulmonary:     Effort: Pulmonary effort is normal. No respiratory distress.     Breath sounds: Normal breath sounds.     Comments: Breathing comfortably at rest, CTABL, no wheezing, rales or other adventitious sounds auscultated Abdominal:     Palpations: Abdomen is soft.     Tenderness: There is no abdominal tenderness.     Comments: Nontender to light deep palpation throughout abdomen  Skin:    General: Skin is warm and dry.  Neurological:     Mental Status: She is alert.     Comments: Patient A&O x3, cranial nerves II-XII grossly intact, strength at shoulders, hips and knees 5/5, equal bilaterally, patellar reflex 2+ bilaterally. Gait without abnormality.      UC Treatments / Results  Labs (all labs ordered are listed, but only abnormal results are displayed) Labs Reviewed - No data to display  EKG None  Radiology No results found.  Procedures Procedures (including critical care time)  Medications Ordered in UC Medications - No data to display  Initial  Impression / Assessment and Plan / UC Course  I have reviewed the triage vital signs and the nursing notes.  Pertinent labs & imaging results that were available during my care of the patient were reviewed by me and considered in my medical decision making (see chart for details).     Blood pressure elevated, improved on second recheck.  No neuro deficits.  Will refill amlodipine, Zofran as needed for nausea, continue to monitor for resolution of symptoms with restarting blood pressure medicine.  Follow-up with PCP for further blood pressure management and  refills.  Also follow-up for further evaluation of decreased appetite if symptoms persist.  Drink plenty of fluids to treat any dehydration that is contributing to symptoms.Discussed strict return precautions. Patient verbalized understanding and is agreeable with plan.  Final Clinical Impressions(s) / UC Diagnoses   Final diagnoses:  Essential hypertension  Medication refill     Discharge Instructions     I have refilled your amlodipine Please take one daily Monitor your blood pressure and for resolution of other symptoms Please return if blood pressure remaining elevated, developing worsening headache, vision changes, chest pain or shortness of breath  Use zofran under the tongue 30 minutes before meals to help with nausea.  Drink plenty of fluids and try to eat normal amount of solids Follow up if symptoms persisting, worsening, developing abdominal pain  Please follow up with your primary doctor for further blood pressure management/refills, further workup of decreased appetite   ED Prescriptions    Medication Sig Dispense Auth. Provider   amLODipine (NORVASC) 5 MG tablet Take 1 tablet (5 mg total) by mouth daily. 90 tablet Wieters, Hallie C, PA-C   ondansetron (ZOFRAN ODT) 4 MG disintegrating tablet Take 1 tablet (4 mg total) by mouth every 8 (eight) hours as needed for nausea or vomiting. 20 tablet Wieters, DellHallie C, PA-C      Controlled Substance Prescriptions [place] Controlled Substance Registry consulted? Not Applicable   Lew DawesWieters, Hallie C, [place]PA-C 06/26/18 1113

## 2018-09-13 ENCOUNTER — Ambulatory Visit: Payer: BLUE CROSS/BLUE SHIELD | Admitting: Family Medicine

## 2018-09-13 NOTE — Progress Notes (Deleted)
   Subjective   Patient ID: Monica Li    DOB: [birth year], 48 y.o. female   MRN: [id number]  CC: "***"  HPI: Monica Li is a 48 y.o. female who presents to clinic today for the following:  ***: ***  ROS: see HPI for pertinent.  PMFSH: ***. Smoking status reviewed. Medications reviewed.  Objective   There were no vitals taken for this visit. Vitals and nursing note reviewed.  General: well nourished, well developed, NAD with non-toxic appearance HEENT: normocephalic, atraumatic, moist mucous membranes Neck: supple, non-tender without lymphadenopathy Cardiovascular: regular rate and rhythm without murmurs, rubs, or gallops Lungs: clear to auscultation bilaterally with normal work of breathing Abdomen: soft, non-tender, non-distended, normoactive bowel sounds Skin: warm, dry, no rashes or lesions, cap refill < 2 seconds Extremities: warm and well perfused, normal tone, no edema  Assessment & Plan   No problem-specific Assessment & Plan notes found for this encounter.  No orders of the defined types were placed in this encounter.  No orders of the defined types were placed in this encounter.   Durward Parcel, DO [facility], PGY-3 09/13/2018, 8:13 AM

## 2018-09-15 ENCOUNTER — Telehealth: Payer: Self-pay | Admitting: Family Medicine

## 2018-09-15 NOTE — Telephone Encounter (Addendum)
The patient's PCP contacted me due to multiple No-shows to her appointment. At this point, she qualifies for dismissal from our clinic based on our policy. She already had more than six no-shows in 6 months. Her last no-show was 09/13/18. Communication barrier remains an issue for this patient. I worry that she might not understand or perhaps not aware of our policy.  I called her using a pacific interpreter ID# [id number].  I discussed our concern regarding her multiple no-shows to her appointments and our dismissal policy regarding this behavior.  The patient stated that she was not aware of the policy before now. She also mentioned that she came late to her appointment on 09/13/18 and was told by the front office staff that she would need to reschedule. Hence that counted as a no-show for her.  She stated that each time she comes late, they make her reschedule her appointment. Hence, whenever she notices that she is running late to her appointment, she will decide not to come all together.  I gave her the following pieces of advice: 1. Each patient's appointment slot lasts for 15 mins. Hence if you are a few minutes late, it creates a ripple effect in the clinic schedule for the day. 2. If a patient comes in more than 15 minutes late, they will not be seen by their provider and will need to reschedule unless stated otherwise by their PCP. 3. If a patient is not able to make their appointment, they need to cancel 24 hours in advance so that it does not count as a no-show visit, and the slot would be open up for other patients to use. 4. I reminded her that any patient with more than three no-shows in 6 months would be dismissed from our practice. The patient stated that she does not want to be dismissed. She was very apologetic about her behavior, which she attributed to ignorance. I told her that we would give her one more chance, and if she continues with this behavior, we will dismiss her from the  practice.  She stated that although she has issues with transportation, which makes her come late to her appointment, she will make sure she leaves home on time to arrive even more than 30 minutes ahead of her scheduled appointment. I advised her that we recommend 5-10 arrival before her schedule. However, if she feels comfortable coming in earlier, that would be fine.  She is aware of her follow-up appointment with Dr. Abelardo Diesel for April 15th.

## 2018-10-18 ENCOUNTER — Ambulatory Visit: Payer: BLUE CROSS/BLUE SHIELD | Admitting: Family Medicine

## 2019-01-12 ENCOUNTER — Ambulatory Visit: Payer: BLUE CROSS/BLUE SHIELD | Admitting: Family Medicine

## 2019-01-12 ENCOUNTER — Other Ambulatory Visit: Payer: Self-pay

## 2019-01-12 ENCOUNTER — Ambulatory Visit (INDEPENDENT_AMBULATORY_CARE_PROVIDER_SITE_OTHER): Payer: BC Managed Care – PPO | Admitting: Family Medicine

## 2019-01-12 VITALS — BP 145/100 | HR 79

## 2019-01-12 DIAGNOSIS — N926 Irregular menstruation, unspecified: Secondary | ICD-10-CM | POA: Diagnosis not present

## 2019-01-12 DIAGNOSIS — R1030 Lower abdominal pain, unspecified: Secondary | ICD-10-CM

## 2019-01-12 DIAGNOSIS — I10 Essential (primary) hypertension: Secondary | ICD-10-CM

## 2019-01-12 LAB — POCT URINE PREGNANCY: Preg Test, Ur: NEGATIVE

## 2019-01-12 NOTE — Patient Instructions (Signed)
It was great meeting you today!  Today we decided to keep your blood pressure medication at the same dose.  We can get a urine pregnancy test.  I think your stomach pain and lack of appetite is due to constipation.  For this I recommend increasing your water intake and fiber intake.  I also recommend picking up an over-the-counter medication called MiraLAX.  I will take a capful per day.  I gave you a handout for this medication, he can pick this up over-the-counter at any pharmacy.  Ilikuwa mkutano mkubwa kwako leo! Gerome Apley kutunza dawa ya shinikizo la damu kwa kipimo kile kile. Tunaweza kupata mtihani wa ujauzito wa mkojo. Nadhani maumivu ya tumbo lako na ukosefu wa hamu ya chakula ni kwa sababu ya [place]. Kwa hili nilipendekeza [place] wa maji na ulaji wa nyuzi. Ninapendekeza pia kuchukua dawa ya kukabiliana na inayoitwa MiraLAX. [place] mateka kwa siku. [employer] kwa dawa hii, anaweza kuchukua up-counter-hii [place] maduka ya dawa yoyote.

## 2019-01-15 ENCOUNTER — Encounter: Payer: Self-pay | Admitting: Family Medicine

## 2019-01-15 DIAGNOSIS — N926 Irregular menstruation, unspecified: Secondary | ICD-10-CM | POA: Insufficient documentation

## 2019-01-15 NOTE — Assessment & Plan Note (Signed)
BP 145/100 in clinic today.  Counseled patient that I do not think that stopping her blood pressure medication is a good idea at this time.  Would like to increase to 10 mg with patient did not want to.  Recommended continuing the 5 mg as a middle ground.

## 2019-01-15 NOTE — Assessment & Plan Note (Signed)
Pregnancy test negative.  Unclear reason for missed menstrual cycles.  Patient is 64 could perhaps be experiencing early menopause.  Follow-up as needed.

## 2019-01-15 NOTE — Assessment & Plan Note (Addendum)
Likely secondary to constipation given her lack of bowel movements recently.  Recommend starting MiraLAX, increasing water intake, adding fiber to diet.  Follow-up as needed.

## 2019-01-15 NOTE — Progress Notes (Signed)
  Swahili interpreter was used during the entire encounter  HPI 48 year old patient who presents for blood pressure management.  Patient was seen at [facility] affiliated emergency room for a blood pressure of 188/110 on 08/17/2018.  She was discharged on amlodipine 10 mg and Lotensin 10 mg.  Patient states that she is only been taking amlodipine 5 mg since then.  Patient states that she would like to be off of blood pressure medication and wants to discuss stopping it.  Patient also states she has not had a menstrual cycle since April.  She has had bloating abdominal pain usually starting around meals for the last month or 2.  Patient states that she has not been having regular bowel movements stating that she is been having only 2 or so per week recently.  CC: Blood pressure management   ROS:   Review of Systems See HPI for ROS.   CC, SH/smoking status, and VS noted  Objective: BP (!) 145/100   Pulse 79   SpO2 75%  Gen: 48 year old female, no acute distress, resting comfortably HEENT: NCAT, EOMI, PERRL CV: RRR, no murmur Resp: CTAB, no wheezes, non-labored Abd: SNTND, BS present, no guarding or organomegaly Neuro: Alert and oriented, Speech clear, No gross deficits   Assessment and plan:  Primary hypertension BP 145/100 in clinic today.  Counseled patient that I do not think that stopping her blood pressure medication is a good idea at this time.  Would like to increase to 10 mg with patient did not want to.  Recommended continuing the 5 mg as a middle ground.  Lower abdominal pain Likely secondary to constipation given her lack of bowel movements recently.  Recommend starting MiraLAX, increasing water intake, adding fiber to diet.  Follow-up as needed.  Missed menses Pregnancy test negative.  Unclear reason for missed menstrual cycles.  Patient is 87 could perhaps be experiencing early menopause.  Follow-up as needed.   Orders Placed This Encounter  Procedures  . POCT  urine pregnancy    No orders of the defined types were placed in this encounter.    Guadalupe Dawn MD PGY-3 Family Medicine Resident  01/15/2019 4:53 PM

## 2019-01-17 ENCOUNTER — Telehealth: Payer: Self-pay | Admitting: Family Medicine

## 2019-01-17 NOTE — Telephone Encounter (Signed)
Please let patient know that her pregnancy test was negative.  Guadalupe Dawn MD PGY-2 Family Medicine Resident

## 2019-01-19 ENCOUNTER — Telehealth: Payer: Self-pay

## 2019-01-19 NOTE — Telephone Encounter (Signed)
Called patient to inform her that pregnancy test was negative.  Patient verbalizes understanding.  Patient had a high reading of blood pressure on the day of her visit and would like to restart amlodipine which she was on in December 2019. This was prescribed by another office.  Ozella Almond, [place]

## 2019-01-22 ENCOUNTER — Other Ambulatory Visit: Payer: Self-pay | Admitting: Family Medicine

## 2019-01-22 DIAGNOSIS — I10 Essential (primary) hypertension: Secondary | ICD-10-CM

## 2019-01-22 MED ORDER — AMLODIPINE BESYLATE 5 MG PO TABS: 5.0000 mg | ORAL_TABLET | Freq: Every day | ORAL | 0 refills | Status: DC

## 2019-01-22 NOTE — Telephone Encounter (Signed)
Refilled the amlodipine. The patient had allegedly been taking it during her most recent clinic visit. We had even discussed going up on her dose but she declined.  Guadalupe Dawn MD PGY-3 Family Medicine Resident

## 2019-04-18 ENCOUNTER — Ambulatory Visit (INDEPENDENT_AMBULATORY_CARE_PROVIDER_SITE_OTHER): Payer: BC Managed Care – PPO | Admitting: Family Medicine

## 2019-04-18 ENCOUNTER — Other Ambulatory Visit: Payer: Self-pay

## 2019-04-18 VITALS — BP 145/80 | HR 74 | Wt 202.4 lb

## 2019-04-18 DIAGNOSIS — I10 Essential (primary) hypertension: Secondary | ICD-10-CM

## 2019-04-18 DIAGNOSIS — R7303 Prediabetes: Secondary | ICD-10-CM | POA: Diagnosis not present

## 2019-04-18 DIAGNOSIS — L700 Acne vulgaris: Secondary | ICD-10-CM | POA: Diagnosis not present

## 2019-04-18 DIAGNOSIS — R635 Abnormal weight gain: Secondary | ICD-10-CM | POA: Diagnosis not present

## 2019-04-18 DIAGNOSIS — R350 Frequency of micturition: Secondary | ICD-10-CM | POA: Diagnosis not present

## 2019-04-18 LAB — POCT GLYCOSYLATED HEMOGLOBIN (HGB A1C): Hemoglobin A1C: 5.9 % — AB (ref 4.0–5.6)

## 2019-04-18 MED ORDER — AMLODIPINE BESYLATE 10 MG PO TABS: 10.0000 mg | ORAL_TABLET | Freq: Every day | ORAL | 1 refills | Status: DC

## 2019-04-18 NOTE — Patient Instructions (Signed)
It was great seeing you again today!  Your blood pressures little bit high.  I think that we should increase your amlodipine to 10 mg daily.  I gave you a 90-day supply of this with a refill.  Also print off a handout for over-the-counter eyedrops for your eye watering.  Can you check for diabetes.  Will let you know the results of this when they come back.  If you would like to meet talk with me about weight loss counseling please schedule another appointment at your earliest convenience.   Ilikuwa nzuri [place] tena leo! Shinikizo la damu yako juu kidogo. Nadhani tunapaswa kuongeza amlodipine yako hadi 10 mg kila siku. Nilikupa usambazaji wa siku 90 ya hii na [place] tena. Pia chapisha kitini kwa macho ya juu ya kaunta kwa kumwagilia macho yako. Je! Legrand Rams ugonjwa wa kisukari. Tut[place]ujulisha matokeo ya hii watakaporudi. Macy Mis ungependa Blenda Bridegroom na majadiliano nami juu ya ushauri wa kupunguza uzito tafadhali panga miadi mingine kwa urahisi wako wa [place].

## 2019-04-23 ENCOUNTER — Encounter: Payer: Self-pay | Admitting: Family Medicine

## 2019-04-23 DIAGNOSIS — R7303 Prediabetes: Secondary | ICD-10-CM | POA: Insufficient documentation

## 2019-04-23 DIAGNOSIS — R635 Abnormal weight gain: Secondary | ICD-10-CM | POA: Insufficient documentation

## 2019-04-23 NOTE — Assessment & Plan Note (Signed)
a1c 5.9 so technically pre-diabetes range. Manage with diet and exercise for now. Will recheck in 3 months.

## 2019-04-23 NOTE — Assessment & Plan Note (Signed)
BP 145/80, having subjective symptoms which could be associated with hypertension. Will increase amlodipine to 10mg  daily. Follow up in 2-3 weeks for bmp and bp recheck.

## 2019-04-23 NOTE — Assessment & Plan Note (Signed)
Up 3 lbs since 10/2017. Up 30 lbs since initial clinic visit. Gave brief counseling on nutrition and exercise. Can follow up as needed for dedicated weight loss visit.

## 2019-04-23 NOTE — Assessment & Plan Note (Signed)
Improved significantly. No acne appreciated on exam.

## 2019-04-23 NOTE — Progress Notes (Signed)
  [employer] interpreter used during the entirety of exam  HPI 48 year old female who presents for hypertension management. Patient has been taking amlodipine 5mg  daily and believes her pressures have been running a little high. She states that she has been having usual headaches more frequently. No other symptoms as far as she knows.  The patient is also interested in weight loss counseling. She has "gained a few pounds" during covid and thinks this might be contributing to her BP increase.  Lastly she would like an a1c check as she is worried she might have diabetes. She does not have any symptoms or frequent urination, frequent thirst, or neuropathic pain.  CC: blood pressure management   ROS:   Review of Systems See HPI for ROS.   CC, SH/smoking status, and VS noted  Objective: BP (!) 145/80   Pulse 74   Wt 202 lb 6.4 oz (91.8 kg)   SpO2 99%   BMI 31.23 kg/m  Gen: well appearing 48 year old female, no acute distress CV: rrr, no m/r/g. Resp: lungs clear to auscultation bilaterally Neuro: Alert and oriented, Speech clear, No gross deficits Skin: no acne appreciated as compared to previous visits.   Assessment and plan:  Primary hypertension BP 145/80, having subjective symptoms which could be associated with hypertension. Will increase amlodipine to 10mg  daily. Follow up in 2-3 weeks for bmp and bp recheck.  Weight gain Up 3 lbs since 10/2017. Up 30 lbs since initial clinic visit. Gave brief counseling on nutrition and exercise. Can follow up as needed for dedicated weight loss visit.  Pre-diabetes a1c 5.9 so technically pre-diabetes range. Manage with diet and exercise for now. Will recheck in 3 months.  Acne Improved significantly. No acne appreciated on exam.   Orders Placed This Encounter  Procedures  . POCT glycosylated hemoglobin (Hb A1C)    Associate with Z13.1    Meds ordered this encounter  Medications  . amLODipine (NORVASC) 10 MG tablet    Sig: Take 1  tablet (10 mg total) by mouth at bedtime.    Dispense:  90 tablet    Refill:  1   Guadalupe Dawn MD PGY-3 Family Medicine Resident  04/23/2019 10:21 AM

## 2019-06-26 ENCOUNTER — Other Ambulatory Visit: Payer: Self-pay

## 2019-06-26 ENCOUNTER — Ambulatory Visit (HOSPITAL_COMMUNITY)
Admission: RE | Admit: 2019-06-26 | Discharge: 2019-06-26 | Disposition: A | Discharge status: Home / Self Care | Payer: BC Managed Care – PPO | Source: Ambulatory Visit | Attending: Family Medicine | Admitting: Family Medicine

## 2019-06-26 ENCOUNTER — Ambulatory Visit (INDEPENDENT_AMBULATORY_CARE_PROVIDER_SITE_OTHER): Payer: BC Managed Care – PPO | Admitting: Family Medicine

## 2019-06-26 ENCOUNTER — Encounter: Payer: Self-pay | Admitting: Family Medicine

## 2019-06-26 VITALS — BP 124/100 | HR 72 | Ht 68.0 in | Wt 205.2 lb

## 2019-06-26 DIAGNOSIS — R002 Palpitations: Secondary | ICD-10-CM | POA: Insufficient documentation

## 2019-06-26 DIAGNOSIS — I10 Essential (primary) hypertension: Secondary | ICD-10-CM | POA: Diagnosis not present

## 2019-06-26 DIAGNOSIS — R079 Chest pain, unspecified: Secondary | ICD-10-CM

## 2019-06-26 DIAGNOSIS — N926 Irregular menstruation, unspecified: Secondary | ICD-10-CM

## 2019-06-26 DIAGNOSIS — R9431 Abnormal electrocardiogram [ECG] [EKG]: Secondary | ICD-10-CM | POA: Diagnosis not present

## 2019-06-26 LAB — POCT URINE PREGNANCY: Preg Test, Ur: NEGATIVE

## 2019-06-26 MED ORDER — AMLODIPINE BESYLATE 10 MG PO TABS: 10.0000 mg | ORAL_TABLET | Freq: Every day | ORAL | 1 refills | Status: DC

## 2019-06-26 NOTE — Patient Instructions (Addendum)
It was great seeing you again today!  While I do not think that there is any immediate concern with your heart, there is a small finding on your EKG which could perhaps be more worrisome for your long-term.  For that I will make a cardiology referral to have you see a specialist.  They should be in contact with you in 1 to 2 weeks.  Your blood pressure is good.  I think continuing your amlodipine 10 mg daily is a good idea.  I refilled this medication.  Your hemoglobin A1c from 2 months ago was 5.9.  We can check this again next time I see you.  This is a pretty decent result and means that you do not have diabetes, but do have prediabetes..  We got a urine pregnancy test, I will call you with these results.  -------------------------------------------------------------------------------------------------- Toribio Harbour tena leo! Wakati sidhani kwamba kuna wasiwasi wowote wa [place] na moyo wako, [place] ugunduzi mdogo [place] EKG yako ambayo inaweza kuwa mbaya zaidi kwa muda wako mrefu. Kwa hilo nitafanya rufaa ya ugonjwa wa moyo kukuona mtaalamu. Junita Push Concha Pyo na wewe Marzetta Board wiki 1 hadi 2.  Shinikizo la damu yako ni nzuri. Nadhani kuendelea na amlodipine yako 10 mg kila siku ni wazo nzuri. Nilijaza tena dawa hii.  Hemoglobini yako A1c kutoka miezi 2 iliyopita ilikuwa 5.9. Laurence Spates hii tena wakati mwingine nitakapokuona. Hii ni matokeo mazuri na [place] wa [place], lakini [place] wa kisukari .Marland Kitchen  [place] mtihani wa ujauzito wa mkojo, nitakupigia na matokeo haya.

## 2019-06-26 NOTE — Progress Notes (Signed)
K

## 2019-07-01 ENCOUNTER — Encounter: Payer: Self-pay | Admitting: Family Medicine

## 2019-07-01 DIAGNOSIS — R079 Chest pain, unspecified: Secondary | ICD-10-CM | POA: Insufficient documentation

## 2019-07-01 NOTE — Assessment & Plan Note (Signed)
Patient with substernal chest pain with concerning exertional component.  While her stratification is somewhat low, she does have poor R wave progression on contiguous leads her EKG.  For these reasons I feel she needs further restratification by cardiology in the form of a stress test or alternative modality.

## 2019-07-01 NOTE — Assessment & Plan Note (Signed)
BP 124/100.  On recheck systolic 628, but diastolic at a more reasonable 90.  Refill amlodipine 10 mg.

## 2019-07-01 NOTE — Progress Notes (Signed)
   HPI 48 year old female who presents for chest pain.  The patient states that she has been having intermittent chest pain for the last couple of weeks. The chest pain has been occurring with activity such as walking up stairs, walking for long periods of time.  She does not take anything for the pain.  The pain is usually not reproducible.  She has had no dizzy or fainting spells.  The pain is located substernally and is not present during exam.  She states it will occasionally radiate to her back.  CC: Chest pain   ROS:   Review of Systems See HPI for ROS.   CC, SH/smoking status, and VS noted  Objective: BP (!) 124/100   Pulse 72   Ht 5\' 8"  (1.727 m)   Wt 205 lb 3.2 oz (93.1 kg)   LMP 03/19/2019   SpO2 99%   BMI 31.20 kg/m  Gen: 48 year old female, no acute distress, resting comfortably CV: Regular rate and rhythm, no M/R/G.  No palpable tenderness in her sternal area. Resp: Lungs clear to auscultation bilaterally, no accessory muscle use Abd: SNTND, BS present, no guarding or organomegaly Neuro: Alert and oriented, Speech clear, No gross deficits EKG: Normal sinus rhythm.  Poor R wave progression in leads V1 and V2.  Assessment and plan:  Chest pain Patient with substernal chest pain with concerning exertional component.  While her stratification is somewhat low, she does have poor R wave progression on contiguous leads her EKG.  For these reasons I feel she needs further restratification by cardiology in the form of a stress test or alternative modality.   Orders Placed This Encounter  Procedures  . Ambulatory referral to Cardiology    Referral Priority:   Routine    Referral Type:   Consultation    Referral Reason:   Specialty Services Required    Requested Specialty:   Cardiology    Number of Visits Requested:   1  . POCT urine pregnancy  . EKG 12-Lead    Meds ordered this encounter  Medications  . amLODipine (NORVASC) 10 MG tablet    Sig: Take 1 tablet (10  mg total) by mouth at bedtime.    Dispense:  90 tablet    Refill:  1     Guadalupe Dawn MD PGY-3 Family Medicine Resident  07/01/2019 9:59 PM

## 2019-07-15 NOTE — Progress Notes (Signed)
Cardiology Office Note:   Date:  07/17/2019  NAME:  Monica Li    MRN: [id number] DOB:  11-19-1970   PCP:  Myrene Buddy, MD  Cardiologist:  No primary care provider on file.   Referring MD: Westley Chandler, MD   Chief Complaint  Patient presents with  . Abnormal ECG   History of Present Illness:   Monica Li is a 49 y.o. female with a hx of hypertension, prediabetes who is being seen today for the evaluation of abnormal ekg at the request of Westley Chandler, MD.  She was evaluated by primary care on 12/23 and noted to have what appears to be LVH on her EKG.  She does have septal Q waves which could represent prior infarction but could also be present in the setting of left ventricular hypertrophy.  She reports she is not having any episodes of chest pain.  She reports she is unclear where this came from.  She does report intermittent shortness of breath with exertion.  She works at the [employer].  She reports she can work a full day without any limitations in activity.  She reports that she walks up and down her street and has no limitations with this.  She reports her shortness of breath can occur 1-2 times per month.  Her symptoms are very short-lived and there is no major issues.  Her blood pressure is 139/97 today.  She only takes amlodipine.  I discussed with her that her EKG is consistent with left ventricular hypertrophy in the setting of high blood pressure.  I informed her that she probably needs to be on another blood pressure medication but I also gave her the option for diet and exercise.  She is opted for diet and exercise.  She has no prior history of myocardial infarction or stroke.  No strong family history of any disease at all.  Past Medical History: Past Medical History:  Diagnosis Date  . Hypertension     Past Surgical History: History reviewed. No pertinent surgical history.  Current Medications: Current Meds  Medication Sig  . amLODipine  (NORVASC) 10 MG tablet Take 1 tablet (10 mg total) by mouth at bedtime.  . benzoyl peroxide (BENZOYL PEROXIDE) 5 % external liquid Apply topically 2 (two) times daily.  . diclofenac (VOLTAREN) 75 MG EC tablet Take 1 tablet (75 mg total) by mouth 2 (two) times daily.  . Olopatadine HCl 0.2 % SOLN Apply 1 drop to eye daily as needed.  . ondansetron (ZOFRAN ODT) 4 MG disintegrating tablet Take 1 tablet (4 mg total) by mouth every 8 (eight) hours as needed for nausea or vomiting.     Allergies:    Patient has no known allergies.   Social History: Social History   Socioeconomic History  . Marital status: Married    Spouse name: Not on file  . Number of children: 5  . Years of education: Not on file  . Highest education level: Not on file  Occupational History  . Occupation: [employer]ist  Tobacco Use  . Smoking status: Never Smoker  . Smokeless tobacco: Never Used  Substance and Sexual Activity  . Alcohol use: No  . Drug use: No  . Sexual activity: Not on file  Other Topics Concern  . Not on file  Social History Narrative  . Not on file   Social Determinants of Health   Financial Resource Strain:   . Difficulty of Paying Living Expenses: Not on file  Food Insecurity:   . Worried About Programme researcher, broadcasting/film/video in the Last Year: Not on file  . Ran Out of Food in the Last Year: Not on file  Transportation Needs:   . Lack of Transportation (Medical): Not on file  . Lack of Transportation (Non-Medical): Not on file  Physical Activity:   . Days of Exercise per Week: Not on file  . Minutes of Exercise per Session: Not on file  Stress:   . Feeling of Stress : Not on file  Social Connections:   . Frequency of Communication with Friends and Family: Not on file  . Frequency of Social Gatherings with Friends and Family: Not on file  . Attends Religious Services: Not on file  . Active Member of Clubs or Organizations: Not on file  . Attends Banker Meetings: Not on  file  . Marital Status: Not on file     Family History: Negative   ROS:   All other ROS reviewed and negative. Pertinent positives noted in the HPI.     EKGs/Labs/Other Studies Reviewed:   The following studies were personally reviewed by me today:  EKG:  EKG is ordered today.  The ekg ordered today demonstrates normal sinus rhythm, heart 69, old anteroseptal infarct noted, no acute ischemic changes, LVH, and was personally reviewed by me.   Recent Labs: No results found for requested labs within last 8760 hours.   Recent Lipid Panel    Component Value Date/Time   CHOL 140 10/20/2017 1206   TRIG 96 10/20/2017 1206   HDL 50 10/20/2017 1206   CHOLHDL 2.8 10/20/2017 1206   LDLCALC 71 10/20/2017 1206    Physical Exam:   VS:  BP (!) 139/97   Pulse 69   Ht 5\' 8"  (1.727 m)   Wt 207 lb (93.9 kg)   SpO2 99%   BMI 31.47 kg/m    Wt Readings from Last 3 Encounters:  07/17/19 207 lb (93.9 kg)  06/26/19 205 lb 3.2 oz (93.1 kg)  04/18/19 202 lb 6.4 oz (91.8 kg)    General: Well nourished, well developed, in no acute distress Heart: Atraumatic, normal size  Eyes: PEERLA, EOMI  Neck: Supple, no JVD Endocrine: No thryomegaly Cardiac: Normal S1, S2; RRR; no murmurs, rubs, or gallops Lungs: Clear to auscultation bilaterally, no wheezing, rhonchi or rales  Abd: Soft, nontender, no hepatomegaly  Ext: No edema, pulses 2+ Musculoskeletal: No deformities, BUE and BLE strength normal and equal Skin: Warm and dry, no rashes   Neuro: Alert and oriented to person, place, time, and situation, CNII-XII grossly intact, no focal deficits  Psych: Normal mood and affect   ASSESSMENT:   [name] is a 49 y.o. female who presents for the following: 1. LVH (left ventricular hypertrophy)   2. Essential hypertension     PLAN:   1. LVH (left ventricular hypertrophy) 2. Essential hypertension -EKG consistent with LVH.  Anterior Q waves are common in LVH.  Blood pressure still not at  goal.  She has no symptoms of chest pain.  She has intermittent shortness of breath that appears to not bother her.  She has no limitations with work or is walking around in her neighborhood.  Given the anterior Q waves which are likely related LVH I recommended an echocardiogram.  I have also discussed with her that she likely should be on an additional blood pressure medication.  She has opted for diet and exercise for now.  She also will work  on salt reduction strategies.  I informed her that we can follow-up with the results of her echo by phone and no need for dedicated follow-up.  Disposition: Return if symptoms worsen or fail to improve.  Medication Adjustments/Labs and Tests Ordered: Current medicines are reviewed at length with the patient today.  Concerns regarding medicines are outlined above.  Orders Placed This Encounter  Procedures  . EKG 12-Lead  . ECHOCARDIOGRAM COMPLETE   No orders of the defined types were placed in this encounter.   Patient Instructions  Medication Instructions:  NO CHANGES *If you need a refill on your cardiac medications before your next appointment, please call your pharmacy*  Lab Work: NONE If you have labs (blood work) drawn today and your tests are completely normal, you will receive your results only by: Marland Kitchen MyChart Message (if you have MyChart) OR . A paper copy in the mail If you have any lab test that is abnormal or we need to change your treatment, we will call you to review the results.  Testing/Procedures: Your physician has requested that you have an echocardiogram. Echocardiography is a painless test that uses sound waves to create images of your heart. It provides your doctor with information about the size and shape of your heart and how well your heart's chambers and valves are working. This procedure takes approximately one hour. There are no restrictions for this procedure.  [place]  Follow-Up: AS NEEDED      Signed, Addison Naegeli. Audie Box, [place]  [address] [place], [place] [address] [phone]  07/17/2019 12:01 PM

## 2019-07-17 ENCOUNTER — Ambulatory Visit (INDEPENDENT_AMBULATORY_CARE_PROVIDER_SITE_OTHER): Payer: BC Managed Care – PPO | Admitting: Cardiovascular Disease

## 2019-07-17 ENCOUNTER — Other Ambulatory Visit: Payer: Self-pay

## 2019-07-17 ENCOUNTER — Encounter: Payer: Self-pay | Admitting: Cardiovascular Disease

## 2019-07-17 VITALS — BP 139/97 | HR 69 | Ht 68.0 in | Wt 207.0 lb

## 2019-07-17 DIAGNOSIS — I10 Essential (primary) hypertension: Secondary | ICD-10-CM | POA: Diagnosis not present

## 2019-07-17 DIAGNOSIS — I517 Cardiomegaly: Secondary | ICD-10-CM | POA: Diagnosis not present

## 2019-07-17 NOTE — Patient Instructions (Signed)
Medication Instructions:  NO CHANGES *If you need a refill on your cardiac medications before your next appointment, please call your pharmacy*  Lab Work: NONE If you have labs (blood work) drawn today and your tests are completely normal, you will receive your results only by: Marland Kitchen MyChart Message (if you have MyChart) OR . A paper copy in the mail If you have any lab test that is abnormal or we need to change your treatment, we will call you to review the results.  Testing/Procedures: Your physician has requested that you have an echocardiogram. Echocardiography is a painless test that uses sound waves to create images of your heart. It provides your doctor with information about the size and shape of your heart and how well your heart's chambers and valves are working. This procedure takes approximately one hour. There are no restrictions for this procedure.  [address]  Follow-Up: AS NEEDED

## 2019-07-30 ENCOUNTER — Other Ambulatory Visit: Payer: Self-pay

## 2019-07-30 ENCOUNTER — Ambulatory Visit (HOSPITAL_COMMUNITY): Payer: BC Managed Care – PPO | Attending: Cardiovascular Disease

## 2019-07-30 DIAGNOSIS — I517 Cardiomegaly: Secondary | ICD-10-CM | POA: Diagnosis not present

## 2019-07-30 DIAGNOSIS — I10 Essential (primary) hypertension: Secondary | ICD-10-CM | POA: Diagnosis not present

## 2019-09-04 ENCOUNTER — Ambulatory Visit (INDEPENDENT_AMBULATORY_CARE_PROVIDER_SITE_OTHER): Payer: BC Managed Care – PPO | Admitting: Family Medicine

## 2019-09-04 ENCOUNTER — Other Ambulatory Visit: Payer: Self-pay

## 2019-09-04 ENCOUNTER — Encounter: Payer: Self-pay | Admitting: Family Medicine

## 2019-09-04 DIAGNOSIS — I10 Essential (primary) hypertension: Secondary | ICD-10-CM | POA: Diagnosis not present

## 2019-09-04 DIAGNOSIS — R040 Epistaxis: Secondary | ICD-10-CM | POA: Insufficient documentation

## 2019-09-04 NOTE — Progress Notes (Signed)
   CHIEF COMPLAINT / HPI: 49 year old female who presents for blood pressure checkup.  Patient states that she has been having some headaches and a nosebleed recently thinks is not related to her blood pressure.  She has not been checking her blood pressure at home.  She states that the nosebleeds consist of a few drops every now and then, particular when she wakes up in the morning.  It will last for couple minutes each time.   PERTINENT  PMH / PSH:    OBJECTIVE: BP 136/86   Pulse 81   Wt 209 lb (94.8 kg)   SpO2 98%   BMI 31.78 kg/m   Gen: 49 year old female, no acute distress, resting comfortably HEENT: Mild trauma to bilateral nasal nares..  Mild amount of dried blood noted. CV: Skin warm and dry Resp: No accessory muscle use Neuro: Alert and oriented, Speech clear, No gross deficits   ASSESSMENT / PLAN:  Primary hypertension Well-controlled today.  We will continue amlodipine 10 mg daily.  Bleeding from the nose Likely secondary to nasal trauma.  Patient is unsure with the trauma may have come from she has not been blowing her nose or picking her nose.  Could also be due to frequent temperature and humidity changes of the weather outside recently.  Can take Ocean nasal spray.    Myrene Buddy MD PGY-3 Family Medicine Resident [facility]

## 2019-09-04 NOTE — Assessment & Plan Note (Signed)
Well-controlled today.  We will continue amlodipine 10 mg daily.

## 2019-09-04 NOTE — Assessment & Plan Note (Signed)
Likely secondary to nasal trauma.  Patient is unsure with the trauma may have come from she has not been blowing her nose or picking her nose.  Could also be due to frequent temperature and humidity changes of the weather outside recently.  Can take Ocean nasal spray.

## 2019-11-29 ENCOUNTER — Other Ambulatory Visit: Payer: Self-pay

## 2019-11-29 ENCOUNTER — Encounter: Payer: Self-pay | Admitting: Family Medicine

## 2019-11-29 ENCOUNTER — Ambulatory Visit (INDEPENDENT_AMBULATORY_CARE_PROVIDER_SITE_OTHER): Payer: BC Managed Care – PPO | Admitting: Family Medicine

## 2019-11-29 VITALS — BP 140/75 | HR 66 | Ht 68.19 in | Wt 209.8 lb

## 2019-11-29 DIAGNOSIS — R300 Dysuria: Secondary | ICD-10-CM

## 2019-11-29 DIAGNOSIS — Z0000 Encounter for general adult medical examination without abnormal findings: Secondary | ICD-10-CM

## 2019-11-29 DIAGNOSIS — N926 Irregular menstruation, unspecified: Secondary | ICD-10-CM | POA: Diagnosis not present

## 2019-11-29 DIAGNOSIS — I10 Essential (primary) hypertension: Secondary | ICD-10-CM

## 2019-11-29 DIAGNOSIS — R1030 Lower abdominal pain, unspecified: Secondary | ICD-10-CM | POA: Diagnosis not present

## 2019-11-29 LAB — POCT URINALYSIS DIP (MANUAL ENTRY)
Bilirubin, UA: NEGATIVE
Glucose, UA: NEGATIVE mg/dL
Ketones, POC UA: NEGATIVE mg/dL
Leukocytes, UA: NEGATIVE
Nitrite, UA: NEGATIVE
Protein Ur, POC: NEGATIVE mg/dL
Spec Grav, UA: 1.015 (ref 1.010–1.025)
Urobilinogen, UA: 0.2 E.U./dL
pH, UA: 7.5 (ref 5.0–8.0)

## 2019-11-29 LAB — POCT UA - MICROSCOPIC ONLY

## 2019-11-29 LAB — POCT URINE PREGNANCY: Preg Test, Ur: NEGATIVE

## 2019-11-29 MED ORDER — AMLODIPINE BESYLATE 10 MG PO TABS: 10.0000 mg | ORAL_TABLET | Freq: Every day | ORAL | 1 refills | Status: DC

## 2019-11-29 NOTE — Patient Instructions (Signed)
It was great seeing you again today!  Think your headaches and high blood pressures have been due to not having her blood pressure medication.  I did refill this for 90 days, and gave you recall.  Please let me or your pharmacy know if you are getting close turning out, this likely save you a clinic visit.  For the constipation you can take MiraLAX.  You can start off with 2 capfuls per day, and then increase by half capful every day and to get the results you are looking for.  Please back off if you get diarrhea or are using the bathroom a lot.  We get some urine to check for infection and pregnancy test.  I will give you call her with his results.  ---------------------------------------------------------------------------------------- [name]! Fikiria maumivu yako ya kichwa na shinikizo la damu vimetokana na kutokuwa na dawa ya shinikizo la damu. Nilijaza hii kwa siku 90, na nikakukumbusha. [place]ad[place]i niruhusu au duka la dawa lako ujue ikiwa unakaribia [name], hii inaweza [place] safari ya kliniki.  Kwa kuvimbiwa unaweza kuchukua MiraLAX. [name] na capfuls 2 kwa siku, na kisha uongeze kwa nusu capful kila siku na kupata matokeo unayotafuta. Tafadhali rudi nyuma ikiwa unapata kuhara au unatumia bafuni sana.  [place] mkojo wa [name] maambukizo na mtihani wa ujauzito. Nitakupa simu yake na matokeo yake.

## 2019-11-30 ENCOUNTER — Encounter: Payer: Self-pay | Admitting: Family Medicine

## 2019-11-30 DIAGNOSIS — Z0000 Encounter for general adult medical examination without abnormal findings: Secondary | ICD-10-CM | POA: Insufficient documentation

## 2019-11-30 NOTE — Assessment & Plan Note (Signed)
Overdue for colonoscopy, mammogram, and pap smear. Has denied these in the past. Will try and get back in for colonoscopy referral. Has a history of anemia which warrants evaluation by colonoscopy. Will need to further discuss at next appointment.

## 2019-11-30 NOTE — Assessment & Plan Note (Signed)
Requesting urine pregnancy test.  As I explained to the patient in the past she is likely postmenopausal which would explain her post menses.  Pregnancy test negative.

## 2019-11-30 NOTE — Assessment & Plan Note (Addendum)
Refilled amlodipine.  Gave 90-day.  I informed the patient that if she was to get low she can either let myself or the pharmacy know and we would be happy to refill it before she runs out. The patient will need a repeat bmp to assess her cr and k (low at last check in 2020) at next appointment.

## 2019-11-30 NOTE — Assessment & Plan Note (Signed)
Mild lower abdominal tenderness secondary to constipation.  I gave patient titration instructions for her MiraLAX. recommended starting off at one scoop full per day and titrating up by 1/2 scoop full until she gets desired results.  Recommended decreasing if develops diarrhea or a lot of stools.

## 2019-11-30 NOTE — Progress Notes (Signed)
   Swahili interpreter Reuel Boom utilized throughout entirety of exam, [phone]  CHIEF COMPLAINT / HPI: 49 year old female who presents for hypertension.  Patient normally takes amlodipine 10 mg which controls her pressures well.  States that she has been out of amlodipine for about 2 weeks.  States that she has noticed her blood pressures running in the 140s 150s at home.  Patient is also complaining about constipation.  States that she is going 3 or 4 days without a bowel movement at times.  States that when it does come out it is small amount and quite hard.  She has tried MiraLAX but only a half scoop full per day.  No other symptoms, no other problems.  PERTINENT  PMH / PSH: Hypertension   OBJECTIVE: BP 140/75   Pulse 66   Ht 5' 8.19" (1.732 m)   Wt 209 lb 12.8 oz (95.2 kg)   LMP 10/28/2019 (Exact Date)   SpO2 99%   BMI 31.72 kg/m   Gen: Well-appearing 49 year old, no acute distress CV: Regular rate rhythm, no M/R/G Resp: Lungs clear to auscultation bilaterally, no accessory muscle use Neuro: Alert and oriented, Speech clear, No gross deficits   ASSESSMENT / PLAN:  Primary hypertension Refilled amlodipine tympanograms.  Gave 90-day supply with 1 refill.  I informed the patient that if she was to get low she can either let myself or the pharmacy know and we would be happy to refill it before she runs out.  Lower abdominal pain Mild lower abdominal tenderness secondary to constipation.  I gave patient titration instructions for her MiraLAX. recommended starting off at one scoop full per day and titrating up by 1/2 scoop full until she gets desired results.  Recommended decreasing if develops diarrhea or a lot of stools.  Missed menses Requesting urine pregnancy test.  As I explained to the patient in the past she is likely postmenopausal which would explain her post menses.  Pregnancy test negative.     Myrene Buddy MD PGY-3 Family Medicine Resident [facility]

## 2019-11-30 NOTE — Addendum Note (Signed)
Addended by: Delman Cheadle on: 11/30/2019 10:42 PM   Modules accepted: Orders

## 2019-12-05 ENCOUNTER — Telehealth: Payer: Self-pay | Admitting: *Deleted

## 2019-12-05 NOTE — Telephone Encounter (Signed)
LMOVM informing pt of normal results using swahili interpreter abraham [phone]. Socorro Ebron Bruna Potter, CMA

## 2019-12-05 NOTE — Telephone Encounter (Signed)
-----   Message from Myrene Buddy, MD sent at 11/30/2019 10:11 AM EDT ----- Please let the patient know that her pregnancy test was negative, and her urinalysis was not consistent with a UTI

## 2020-03-21 ENCOUNTER — Other Ambulatory Visit: Payer: Self-pay

## 2020-03-21 ENCOUNTER — Ambulatory Visit (INDEPENDENT_AMBULATORY_CARE_PROVIDER_SITE_OTHER): Payer: BC Managed Care – PPO | Admitting: Family Medicine

## 2020-03-21 ENCOUNTER — Encounter: Payer: Self-pay | Admitting: Family Medicine

## 2020-03-21 VITALS — HR 64 | Wt 205.6 lb

## 2020-03-21 DIAGNOSIS — H5213 Myopia, bilateral: Secondary | ICD-10-CM

## 2020-03-21 DIAGNOSIS — Z1211 Encounter for screening for malignant neoplasm of colon: Secondary | ICD-10-CM | POA: Diagnosis not present

## 2020-03-21 DIAGNOSIS — N926 Irregular menstruation, unspecified: Secondary | ICD-10-CM | POA: Diagnosis not present

## 2020-03-21 DIAGNOSIS — Z0000 Encounter for general adult medical examination without abnormal findings: Secondary | ICD-10-CM | POA: Diagnosis not present

## 2020-03-21 DIAGNOSIS — I10 Essential (primary) hypertension: Secondary | ICD-10-CM | POA: Diagnosis not present

## 2020-03-21 DIAGNOSIS — M25561 Pain in right knee: Secondary | ICD-10-CM

## 2020-03-21 DIAGNOSIS — N912 Amenorrhea, unspecified: Secondary | ICD-10-CM | POA: Diagnosis not present

## 2020-03-21 LAB — POCT URINE PREGNANCY: Preg Test, Ur: NEGATIVE

## 2020-03-21 MED ORDER — DICLOFENAC SODIUM 75 MG PO TBEC: 75.0000 mg | DELAYED_RELEASE_TABLET | Freq: Two times a day (BID) | ORAL | 0 refills | Status: DC

## 2020-03-21 MED ORDER — AMLODIPINE BESYLATE 10 MG PO TABS: 10.0000 mg | ORAL_TABLET | Freq: Every day | ORAL | 2 refills | Status: DC

## 2020-03-21 MED ORDER — MELOXICAM 7.5 MG PO TABS: 7.5000 mg | ORAL_TABLET | Freq: Every day | ORAL | 0 refills | Status: DC

## 2020-03-21 NOTE — Progress Notes (Signed)
    SUBJECTIVE:   CHIEF COMPLAINT / HPI: foot pain and BP follow up   Swahili Interpreter was used during this visit. Imran ER#[id number]  Left Knee Pain  Patient reports that pain started one week ago. She denies any trauma to the knee joint. She describes knee pain as sharp pain located along the anterior aspect of her knee joint. Pain is worse with ambulation and standing for long periods of time. She denies any prior surgeries involving the knee and does not recall hearing any popping or clicking sounds.   HTN  Patient reports she has been able to take her blood pressure medication but ran out four days ago. Denies any chest pain, SOB, has intermittent HA that goes away with pain medication. Denies blurry vision. She does not measure her BP at home. Does sometimes have trouble with distant vision, does not see optometrist.   HM  Patient recommended for colonoscopy. She will take the referral for today.   PERTINENT  PMH / PSH: HTN   OBJECTIVE:   Pulse 64   Wt 205 lb 9.6 oz (93.3 kg)   LMP 11/19/2019 (Exact Date)   SpO2 98%   BMI 31.09 kg/m    General: female appearing stated age in no acute distress Cardio: Normal S1 and S2, no S3 or S4. Rhythm is regular. No murmurs or rubs.  Bilateral radial pulses palpable Pulm: Clear to auscultation bilaterally, no crackles, wheezing, or diminished breath sounds. Normal respiratory effort Abdomen: Bowel sounds normal. Abdomen soft and non-tender.  Extremities:  Bilateral knee edema. Warm/ well perfused. No erythema, no bony deformities  Neuro: pt alert and oriented x4   ASSESSMENT/PLAN:   Primary hypertension Continue amlodipine 10mg  daily    Healthcare maintenance Referral to GI for colonoscopy  Referral to Ophthalmology for eye exam   Missed menses Patient reports this at end of visit.  - counseled on likely onset of menopause  - UPT completed per patient request   Knee pain Prescribed meloxicam  Patient advised to rest knee  as much as possible and use ice/heat to help depending on pain       , MD [facility]

## 2020-03-21 NOTE — Assessment & Plan Note (Signed)
Continue amlodipine 10 mg daily.

## 2020-03-21 NOTE — Patient Instructions (Addendum)
It was a pleasure to see you today!  Thank you for choosing [facility] for your primary care.  Monica Li was seen for foot pain and Blood pressure check.   Our plans for today were:  Continue to take your amlodipine daily.   We will complete a pregnancy test today.   I have submitted a referral for you to see an eye doctor as well as have a colon cancer screening test.   Please take the mobic 7.5 mg daily for two weeks to help with your knee pain. I also recommend putting ice followed by warm towels on your knee to help with pain.   Once you have finished the mobic, you can try the voltaren gel on your knee.    You should return to our clinic as needed.   Best Wishes,   Dr. Neita Garnet

## 2020-04-01 ENCOUNTER — Telehealth: Payer: Self-pay

## 2020-04-01 NOTE — Telephone Encounter (Signed)
Noted and agreed thank you Hannah.  

## 2020-04-01 NOTE — Telephone Encounter (Signed)
Received phone call on nurse line from patient's husband regarding chest pain. Husband reports that pain has been going on for greater than 5 days and would like to schedule an appointment. Advised husband that if patient was currently having chest pain that she should be evaluated in the ED. Husband reports that this has been intermittent. Advised patient to call cardiologist for follow up. Provided with phone number and instructed to go to the ED if chest pain returns.   Veronda Prude, RN

## 2020-04-02 ENCOUNTER — Encounter (HOSPITAL_COMMUNITY): Payer: Self-pay | Admitting: Emergency Medicine

## 2020-04-02 ENCOUNTER — Other Ambulatory Visit: Payer: Self-pay

## 2020-04-02 ENCOUNTER — Ambulatory Visit (HOSPITAL_COMMUNITY)
Admission: EM | Admit: 2020-04-02 | Discharge: 2020-04-02 | Disposition: A | Discharge status: Home / Self Care | Payer: BC Managed Care – PPO | Attending: Family Medicine | Admitting: Family Medicine

## 2020-04-02 DIAGNOSIS — B029 Zoster without complications: Secondary | ICD-10-CM | POA: Diagnosis not present

## 2020-04-02 MED ORDER — VALACYCLOVIR HCL 1 G PO TABS: 1000.0000 mg | ORAL_TABLET | Freq: Three times a day (TID) | ORAL | 0 refills | Status: DC

## 2020-04-02 MED ORDER — TRAMADOL HCL 50 MG PO TABS: 50.0000 mg | ORAL_TABLET | Freq: Four times a day (QID) | ORAL | 0 refills | Status: DC | PRN

## 2020-04-02 MED ORDER — VALACYCLOVIR HCL 1 G PO TABS: 1000.0000 mg | ORAL_TABLET | Freq: Three times a day (TID) | ORAL | 0 refills | Status: AC

## 2020-04-02 MED ORDER — CLOBETASOL PROPIONATE 0.05 % EX SOLN: CUTANEOUS | 0 refills | Status: DC

## 2020-04-02 MED ORDER — PREDNISONE 10 MG PO TABS: ORAL_TABLET | ORAL | 0 refills | Status: DC

## 2020-04-02 NOTE — ED Provider Notes (Signed)
[facility]    CSN: [id number] Arrival date & time: 04/02/20  1624      History   Chief Complaint Chief Complaint  Patient presents with  . Otalgia  . Rash    HPI Monica Li is a 49 y.o. female.   Here today with her daughter who she requests be her translator for today's visit. She has been dealing with right ear pain and now a very painful rash down right side of face and neck and now also onto right scalp that feels like needs sticking into her over and over. Denies loss of hearing, blurry vision, headache, fever chills, new medicines or exposures. Tylenol has not been helping with the pain. Has never experienced this type of rash before.      Past Medical History:  Diagnosis Date  . Hypertension     Patient Active Problem List   Diagnosis Date Noted  . Healthcare maintenance 11/30/2019  . Weight gain 04/23/2019  . Pre-diabetes 04/23/2019  . Missed menses 01/15/2019  . Lower abdominal pain 10/24/2017  . Acne 07/19/2017  . Primary hypertension 05/09/2017  . Language barrier 04/12/2017    History reviewed. No pertinent surgical history.  OB History   No obstetric history on file.      Home Medications    Prior to Admission medications   Medication Sig Start Date End Date Taking? Authorizing Provider  amLODipine (NORVASC) 10 MG tablet Take 1 tablet (10 mg total) by mouth at bedtime. 03/21/20  Yes Simmons-Robinson, Makiera, MD  benzoyl peroxide (BENZOYL PEROXIDE) 5 % external liquid Apply topically 2 (two) times daily. 07/19/17   Durward Parcel, DO  clobetasol (TEMOVATE) 0.05 % external solution Apply to itchy scalp twice daily as needed 04/02/20   Particia Nearing, PA-C  diclofenac (VOLTAREN) 75 MG EC tablet Take 1 tablet (75 mg total) by mouth 2 (two) times daily. Use this gel once you have completed the meloxicam. You will not use both at the same time. 03/21/20   Simmons-Robinson, Makiera, MD  meloxicam (MOBIC) 7.5 MG tablet Take 1  tablet (7.5 mg total) by mouth daily. 03/21/20   Simmons-Robinson, Makiera, MD  Olopatadine HCl 0.2 % SOLN Apply 1 drop to eye daily as needed. 10/20/17   Casey Burkitt, MD  ondansetron (ZOFRAN ODT) 4 MG disintegrating tablet Take 1 tablet (4 mg total) by mouth every 8 (eight) hours as needed for nausea or vomiting. 06/26/18   Wieters, Hallie C, PA-C  predniSONE (DELTASONE) 10 MG tablet Take 6 tabs day one, 5 tabs day two, 4 tabs day three, etc 04/02/20   Particia Nearing, PA-C  traMADol (ULTRAM) 50 MG tablet Take 1 tablet (50 mg total) by mouth every 6 (six) hours as needed. 04/02/20   Particia Nearing, PA-C  valACYclovir (VALTREX) 1000 MG tablet Take 1 tablet (1,000 mg total) by mouth 3 (three) times daily for 7 days. 04/02/20 04/09/20  Particia Nearing, PA-C    Family History Family History  Problem Relation Age of Onset  . Healthy Mother   . Healthy Father     Social History Social History   Tobacco Use  . Smoking status: Never Smoker  . Smokeless tobacco: Never Used  Vaping Use  . Vaping Use: Never used  Substance Use Topics  . Alcohol use: No  . Drug use: No     Allergies   Patient has no known allergies.   Review of Systems Review of Systems PER HPI  Physical Exam Triage Vital Signs ED Triage Vitals  Enc Vitals Group     BP 04/02/20 1818 122/78     Pulse Rate 04/02/20 1818 79     Resp --      Temp 04/02/20 1818 97.7 F (36.5 C)     Temp Source 04/02/20 1818 Oral     SpO2 04/02/20 1818 100 %     Weight --      Height --      Head Circumference --      Peak Flow --      Pain Score 04/02/20 1813 10     Pain Loc --      Pain Edu? --      Excl. in GC? --    No data found.  Updated Vital Signs BP 122/78 (BP Location: Right Arm)   Pulse 79   Temp 97.7 F (36.5 C) (Oral)   LMP 12/30/2019   SpO2 100%   Visual Acuity Right Eye Distance:   Left Eye Distance:   Bilateral Distance:    Right Eye Near:   Left Eye Near:      Bilateral Near:     Physical Exam Vitals and nursing note reviewed.  Constitutional:      Appearance: Normal appearance. She is not ill-appearing.  HENT:     Head: Atraumatic.     Right Ear: Tympanic membrane normal.     Left Ear: Tympanic membrane normal.     Nose: Nose normal.     Mouth/Throat:     Mouth: Mucous membranes are moist.     Pharynx: Oropharynx is clear.  Eyes:     Extraocular Movements: Extraocular movements intact.     Conjunctiva/sclera: Conjunctivae normal.  Cardiovascular:     Rate and Rhythm: Normal rate and regular rhythm.     Heart sounds: Normal heart sounds.  Pulmonary:     Effort: Pulmonary effort is normal.     Breath sounds: Normal breath sounds.  Musculoskeletal:        General: Normal range of motion.     Cervical back: Normal range of motion and neck supple.  Skin:    General: Skin is warm and dry.     Findings: Rash (vesicular erythematous rash from right side of scalp extending down to outer ear, right side of face and right lateral neck) present.  Neurological:     Mental Status: She is alert and oriented to person, place, and time.  Psychiatric:        Mood and Affect: Mood normal.        Thought Content: Thought content normal.        Judgment: Judgment normal.      UC Treatments / Results  Labs (all labs ordered are listed, but only abnormal results are displayed) Labs Reviewed - No data to display  EKG   Radiology No results found.  Procedures Procedures (including critical care time)  Medications Ordered in UC Medications - No data to display  Initial Impression / Assessment and Plan / UC Course  I have reviewed the triage vital signs and the nursing notes.  Pertinent labs & imaging results that were available during my care of the patient were reviewed by me and considered in my medical decision making (see chart for details).     Shingles   Discussed at length about shingles, questions answered. Will tx with  prednisone taper, valtrex, tramadol prn for pain in addition to OTC pain relievers, and clobetasol scalp  soln for itchy painful scalp lesions which are very bothersome for her. F/u with PCP if not resolving in next week or so or worsening at any time.    Final Clinical Impressions(s) / UC Diagnoses   Final diagnoses:  Herpes zoster without complication     Discharge Instructions     Ask your insurance company about when they will cover the Shingrix vaccine series to help protect you in the future.  You can buy hydrocortisone cream over the counter to apply to face as needed for itching    ED Prescriptions    Medication Sig Dispense Auth. Provider   valACYclovir (VALTREX) 1000 MG tablet  (Status: Discontinued) Take 1 tablet (1,000 mg total) by mouth 3 (three) times daily for 7 days. 21 tablet Particia Nearing, [place]   predniSONE (DELTASONE) 10 MG tablet  (Status: Discontinued) Take 6 tabs day one, 5 tabs day two, 4 tabs day three, etc 21 tablet Particia Nearing, PA-C   traMADol (ULTRAM) 50 MG tablet  (Status: Discontinued) Take 1 tablet (50 mg total) by mouth every 6 (six) hours as needed. 15 tablet Particia Nearing, [place]   clobetasol (TEMOVATE) 0.05 % external solution  (Status: Discontinued) Apply to itchy scalp twice daily as needed 50 mL Particia Nearing, PA-C   clobetasol (TEMOVATE) 0.05 % external solution Apply to itchy scalp twice daily as needed 50 mL Particia Nearing, PA-C   predniSONE (DELTASONE) 10 MG tablet Take 6 tabs day one, 5 tabs day two, 4 tabs day three, etc 21 tablet Particia Nearing, PA-C   traMADol (ULTRAM) 50 MG tablet Take 1 tablet (50 mg total) by mouth every 6 (six) hours as needed. 15 tablet Particia Nearing, [place]   valACYclovir (VALTREX) 1000 MG tablet Take 1 tablet (1,000 mg total) by mouth 3 (three) times daily for 7 days. 21 tablet Particia Nearing, [place]     I have reviewed the PDMP during this encounter.     Particia Nearing, [place] 04/02/20 2000

## 2020-04-02 NOTE — Discharge Instructions (Addendum)
Ask your insurance company about when they will cover the Shingrix vaccine series to help protect you in the future.  You can buy hydrocortisone cream over the counter to apply to face as needed for itching

## 2020-04-02 NOTE — ED Triage Notes (Signed)
Pt c/o right ear pain onset Sunday. She is having headache and rash on her neck as well. She has been taking tylenol for the pain with no relief.

## 2020-04-03 ENCOUNTER — Other Ambulatory Visit: Payer: Self-pay | Admitting: Family Medicine

## 2020-04-03 DIAGNOSIS — M25569 Pain in unspecified knee: Secondary | ICD-10-CM | POA: Insufficient documentation

## 2020-04-03 NOTE — Assessment & Plan Note (Signed)
Patient reports this at end of visit.  - counseled on likely onset of menopause  - UPT completed per patient request

## 2020-04-03 NOTE — Assessment & Plan Note (Signed)
Prescribed meloxicam  Patient advised to rest knee as much as possible and use ice/heat to help depending on pain

## 2020-04-03 NOTE — Assessment & Plan Note (Signed)
Referral to GI for colonoscopy  Referral to Ophthalmology for eye exam

## 2020-04-08 ENCOUNTER — Telehealth: Payer: Self-pay | Admitting: Family Medicine

## 2020-04-08 NOTE — Telephone Encounter (Signed)
Monica Li from [facility] called to inform referral coordinator, that they no longer accept Medicaid as of 12/18/19. She said sorry for inconvenience, their office will not be able to accept her as a pt. Can call her back @ [phone].

## 2020-04-09 NOTE — Telephone Encounter (Signed)
Called and confirmed this with [place].  Patient didn't have medicaid when the referral was initially placed but as of Oct. 1, it is active.  Referral sent to [facility] ok per [facility] website.  Kashmere Daywalt,CMA

## 2020-04-15 ENCOUNTER — Other Ambulatory Visit: Payer: Self-pay | Admitting: Family Medicine

## 2020-04-25 ENCOUNTER — Ambulatory Visit (INDEPENDENT_AMBULATORY_CARE_PROVIDER_SITE_OTHER): Payer: BC Managed Care – PPO | Admitting: Family Medicine

## 2020-04-25 ENCOUNTER — Other Ambulatory Visit: Payer: Self-pay

## 2020-04-25 ENCOUNTER — Encounter: Payer: Self-pay | Admitting: Family Medicine

## 2020-04-25 VITALS — BP 128/80 | HR 80 | Wt 207.4 lb

## 2020-04-25 DIAGNOSIS — I10 Essential (primary) hypertension: Secondary | ICD-10-CM

## 2020-04-25 DIAGNOSIS — Z1159 Encounter for screening for other viral diseases: Secondary | ICD-10-CM

## 2020-04-25 DIAGNOSIS — Z0000 Encounter for general adult medical examination without abnormal findings: Secondary | ICD-10-CM

## 2020-04-25 DIAGNOSIS — R7303 Prediabetes: Secondary | ICD-10-CM | POA: Diagnosis not present

## 2020-04-25 LAB — POCT GLYCOSYLATED HEMOGLOBIN (HGB A1C): Hemoglobin A1C: 5.8 % — AB (ref 4.0–5.6)

## 2020-04-25 MED ORDER — AMLODIPINE BESYLATE 10 MG PO TABS: 10.0000 mg | ORAL_TABLET | Freq: Every day | ORAL | 2 refills | Status: DC

## 2020-04-25 NOTE — Assessment & Plan Note (Addendum)
Blood pressure within normal range today at 128/80.  Patient has been out of amlodipine for 1 week.  Patient states that she has been tolerating medication with no problems.  Plan for patient to continue amlodipine and have blood pressure check in 2-3 weeks at follow-up appointment. -Recommend patient purchase blood pressure cuff and measure blood pressure 1-2 times daily -Continue amlodipine 10 mg daily -BMP -Patient counseled on blood pressure goals and monitoring for symptoms of low blood pressure, see AVS -Counseled patient on seeking immediate care if she begins to have persistent headaches, blurry vision, chest pain: Patient voiced understanding

## 2020-04-25 NOTE — Patient Instructions (Addendum)
It was a pleasure to see you today!  Thank you for choosing [facility] for your primary care.  Monica Li was seen for blood pressure follow up.   Our plans for today were:  Please continue your amlodipine daily. I have sent a prescription to the pharmacy.   We will collect blood work today to check your electrolytes and blood sugar levels to test for diabetes since you have had levels in the prediabetes range before. I will call with abnormal results.   We will also complete screening tests.   Please check your blood pressure at home with goal of less than 140 / less than 90.   Your pressure may be too low if it is less than 100/ less than 70.   To keep you healthy, please keep in mind the following health maintenance items that you are due for:   1. COVID vaccine  2.  influenza vaccine  3. Pap smear    You should return to our clinic in 2-3 weeks for blood pressure check and a pap smear.   Best Wishes,   Dr. Neita Garnet

## 2020-04-25 NOTE — Assessment & Plan Note (Signed)
Hemoglobin A1c

## 2020-04-25 NOTE — Assessment & Plan Note (Signed)
Hepatitis C screening Patient declines influenza vaccine today, will have vaccine on 11/5

## 2020-04-25 NOTE — Progress Notes (Signed)
    SUBJECTIVE:   CHIEF COMPLAINT / HPI: HTN med management   Patient presents today in order to ask questions about her amlodipine and course of treatment as well as to get refills for her medication.  She reports that she has been out of amlodipine for close to 1 week.  Patient does not check blood pressure at home due to not having a blood pressure cuff currently.  Patient son is present for this visit.  Both patient and her son have questions as to how long she will need to be on amlodipine.  Patient states that she has not had any problems since beginning the blood pressure medication.  She states that since she has been out, she has been experiencing some intermittent headaches.  She denies any chest pain or vision changes since being out of the medication.   Prediabetes Patient reports that she would like to be checked for diabetes today.  Patient previously in prediabetes range with hemoglobin A1c of 5.9 in 2020.  Healthcare maintenance Patient would like to have influenza vaccine on 11/5 visit.  Patient will have hepatitis C screening today.  PERTINENT  PMH / PSH:  HTN  Prediabetes   OBJECTIVE:   BP 128/80   Pulse 80   Wt 207 lb 6.4 oz (94.1 kg)   SpO2 97%   BMI 31.36 kg/m   Physical exam General: female appearing stated age in no acute distress Cardio: Normal S1 and S2, no S3 or S4. Rhythm is regular. No murmurs or rubs.  Bilateral radial pulses palpable Pulm: Clear to auscultation bilaterally, no crackles, wheezing, or diminished breath sounds. Normal respiratory effort, stable on room air Abdomen: Bowel sounds normal. Abdomen soft and non-tender. Extremities: trace LE peripheral edema. Warm/ well perfused.  Neuro: pt alert and oriented x4   ASSESSMENT/PLAN:   Primary hypertension Blood pressure within normal range today at 128/80.  Patient has been out of amlodipine for 1 week.  Patient states that she has been tolerating medication with no problems.  Plan for patient  to continue amlodipine and have blood pressure check in 2-3 weeks at follow-up appointment. -Recommend patient purchase blood pressure cuff and measure blood pressure 1-2 times daily -Continue amlodipine 10 mg daily -BMP -Patient counseled on blood pressure goals and monitoring for symptoms of low blood pressure, see AVS -Counseled patient on seeking immediate care if she begins to have persistent headaches, blurry vision, chest pain: Patient voiced understanding  Prediabetes Hemoglobin A1c  Healthcare maintenance Hepatitis C screening Patient declines influenza vaccine today, will have vaccine on 11/5   Follow-up on 05/09/2020 for Pap smear and influenza vaccine  Ronnald Ramp, MD [facility]

## 2020-04-26 LAB — BASIC METABOLIC PANEL
BUN/Creatinine Ratio: 9 (ref 9–23)
BUN: 6 mg/dL (ref 6–24)
CO2: 25 mmol/L (ref 20–29)
Calcium: 9 mg/dL (ref 8.7–10.2)
Chloride: 104 mmol/L (ref 96–106)
Creatinine, Ser: 0.69 mg/dL (ref 0.57–1.00)
GFR calc Af Amer: 117 mL/min/{1.73_m2} (ref >59)
GFR calc non Af Amer: 101 mL/min/{1.73_m2} (ref >59)
Glucose: 97 mg/dL (ref 65–99)
Potassium: 3.6 mmol/L (ref 3.5–5.2)
Sodium: 141 mmol/L (ref 134–144)

## 2020-04-26 LAB — HEPATITIS C ANTIBODY: Hep C Virus Ab: <0.1 s/co ratio (ref 0.0–0.9)

## 2020-05-05 ENCOUNTER — Other Ambulatory Visit: Payer: Self-pay | Admitting: Family Medicine

## 2020-05-09 ENCOUNTER — Ambulatory Visit (INDEPENDENT_AMBULATORY_CARE_PROVIDER_SITE_OTHER): Payer: BC Managed Care – PPO | Admitting: Family Medicine

## 2020-05-09 ENCOUNTER — Encounter: Payer: Self-pay | Admitting: Family Medicine

## 2020-05-09 ENCOUNTER — Other Ambulatory Visit (HOSPITAL_COMMUNITY)
Admission: RE | Admit: 2020-05-09 | Discharge: 2020-05-09 | Disposition: A | Discharge status: Home / Self Care | Payer: BC Managed Care – PPO | Source: Ambulatory Visit | Attending: Family Medicine | Admitting: Family Medicine

## 2020-05-09 ENCOUNTER — Other Ambulatory Visit: Payer: Self-pay

## 2020-05-09 VITALS — BP 128/76 | HR 76 | Ht 68.0 in | Wt 208.0 lb

## 2020-05-09 DIAGNOSIS — Z124 Encounter for screening for malignant neoplasm of cervix: Secondary | ICD-10-CM

## 2020-05-09 DIAGNOSIS — Z01419 Encounter for gynecological examination (general) (routine) without abnormal findings: Secondary | ICD-10-CM | POA: Diagnosis not present

## 2020-05-09 NOTE — Progress Notes (Signed)
° ° °  SUBJECTIVE:   CHIEF COMPLAINT / HPI: pap smear  Swahili Interpreter was used during this encounter   Patient reports having no vaginal discharge and abnormal bleeding stopped. She denies any lower abdominal pain or problems with urinating. She denies any breast tenderness or leakage from her nipples. She states that she has never had a pap smear before.   PERTINENT  PMH / PSH:  Pre DM  Language Barrier   OBJECTIVE:   BP 128/76    Pulse 76    Ht 5\' 8"  (1.727 m)    Wt 208 lb (94.3 kg)    SpO2 98%    BMI 31.63 kg/m  General: well appearing in NAD  Genitalia:  Normal introitus for age, no external lesions, no vaginal discharge, mucosa pink and moist, no vaginal or cervical lesions, no vaginal atrophy, no friaility or hemorrhage, normal uterus size and position, no adnexal masses or tenderness Breast Exam: no tenderness, no skin changes, no nipple discharge or bleeding, no palpable masses in breast tissue or chest well, no axillary masses or nodules palpated   ASSESSMENT/PLAN:   Well woman exam with routine gynecological exam Pap smear and breast exam completed today  Will discuss results with patient when available      , MD [facility]

## 2020-05-09 NOTE — Patient Instructions (Signed)
The results for your pap smear should return sometime early next week and I will contact you with the results.   You may have some spotting for the next few hours, this should stop by tomorrow. This is normal after a pap.

## 2020-05-11 DIAGNOSIS — Z01419 Encounter for gynecological examination (general) (routine) without abnormal findings: Secondary | ICD-10-CM | POA: Insufficient documentation

## 2020-05-11 NOTE — Assessment & Plan Note (Addendum)
Pap smear and breast exam completed today  Will discuss results with patient when available

## 2020-05-12 LAB — CYTOLOGY - PAP
Comment: NEGATIVE
Diagnosis: NEGATIVE
High risk HPV: NEGATIVE

## 2020-07-21 ENCOUNTER — Ambulatory Visit: Payer: BC Managed Care – PPO | Admitting: Family Medicine

## 2020-07-28 ENCOUNTER — Other Ambulatory Visit: Payer: Self-pay

## 2020-07-28 ENCOUNTER — Ambulatory Visit (INDEPENDENT_AMBULATORY_CARE_PROVIDER_SITE_OTHER): Payer: BC Managed Care – PPO | Admitting: Family Medicine

## 2020-07-28 ENCOUNTER — Encounter: Payer: Self-pay | Admitting: Family Medicine

## 2020-07-28 DIAGNOSIS — G4700 Insomnia, unspecified: Secondary | ICD-10-CM

## 2020-07-28 DIAGNOSIS — I10 Essential (primary) hypertension: Secondary | ICD-10-CM | POA: Diagnosis not present

## 2020-07-28 MED ORDER — HYDROCHLOROTHIAZIDE 25 MG PO TABS: 25.0000 mg | ORAL_TABLET | Freq: Every day | ORAL | 3 refills | Status: DC

## 2020-07-28 MED ORDER — AMLODIPINE BESYLATE 10 MG PO TABS: 10.0000 mg | ORAL_TABLET | Freq: Every day | ORAL | 2 refills | Status: DC

## 2020-07-28 NOTE — Patient Instructions (Addendum)
Hydrochlorothiazide, HCTZ Tablet What is this medicine? HYDROCHLOROTHIAZIDE (hye droe klor oh THYE a zide) is a diuretic. It treats high blood pressure.  What should I tell my health care provider before I take this medicine? They need to know if you have any of these conditions:  dehydration  diabetes  gout  kidney disease or kidney stones  liver disease  pancreatitis  small amount of urine or difficulty passing urine  an unusual or allergic reaction to hydrochlorothiazide, HCTZ, other medicines, foods, dyes, or preservatives  pregnant or trying to get pregnant  breast-feeding   How should I use this medicine? Take this medicine by mouth with a glass of water. Follow the directions on your prescription label. You can take this medicine with or without food. However, you should always take it the same way each time. Take your medicine at regular intervals. Do not take it more often than directed. Do not stop taking except on your doctor's advice. Talk to your health care provider about the use of this drug in children. Special care may be needed. Overdosage: If you think you have taken too much of this medicine contact a poison control center or emergency room at once. NOTE: This medicine is only for you. Do not share this medicine with others.  What if I miss a dose? If you miss a dose, take it as soon as you can. If it is almost time for your next dose, take only that dose. Do not take double or extra doses.  What may interact with this medicine?  alcohol  atorvastatin  barbiturates  cholestyramine  colestipol  digoxin  dofetilide  furosemide  irbesartan  lithium  medicines for blood pressure  medicines for diabetes  medicines for fungal infections like ketoconazole  medicines that relax muscles for surgery  narcotic medicines for pain  NSAIDs, medicines for pain and inflammation, like ibuprofen or naproxen  potassium  supplements  steroid medicines like prednisone or cortisone This list may not describe all possible interactions. Give your health care provider a list of all the medicines, herbs, non-prescription drugs, or dietary supplements you use. Also tell them if you smoke, drink alcohol, or use illegal drugs. Some items may interact with your medicine. What should I watch for while using this medicine? Visit your health care provider for regular checks on your progress. Check your blood pressure as directed. Ask your health care provider what your blood pressure should be. Also, find out when you should contact him or her. Women should inform theirhealth care providerif they wish to become pregnant or think they might be pregnant. There is a potential for serious side effects to an unborn child, especially in the second or third trimester. Talk to your health careproviderfor more information. This drug may increase your blood sugar. Ask your health care provider if changes in diet or drugs are needed if you have diabetes. If you have diabetes and take a drug known as an ACE inhibitor or ARB, do not take this drug. Talk to your health care provider if questions. Do not treat yourself for coughs, colds, or pain while you are using this drug without asking your health care provider for advice. Some drugs may increase your blood pressure. Check with your doctor or health care provider if you get an attack of severe diarrhea, nausea, and vomiting, or if you sweat a lot. The loss of too much body fluid can make it dangerous for you to take  this drug. Avoid salt substitutes unless you are told otherwise by your health care provider. Talk to your health care provider about other dietary needs. You may get drowsy or dizzy. Do not drive, use machinery, or do anything that needs mental alertness until you know how thisdrug affects you. Do not stand or sit up quickly, especially if you are an older patient. This reduces the  risk of dizzy or fainting spells. Alcohol may interfere with the effect of this drug. Avoid alcoholic drinks. Talk to your health care provider about your risk of skin cancer. You may be more at risk for skin cancer if you take this drug. Thisdrug can make you more sensitive to the sun. Keep out of the sun. If you cannot avoid being in the sun, wear protective clothing, and use sunscreen. Do not use sun lamps or tanning beds/booths.  What side effects may I notice from receiving this medicine? Side effects that you should report to your doctor or health care professional as soon as possible:  allergic reactions like skin rash or hives, swelling of the hands, feet, face, lips, throat, or tongue  breathing problems  changes in vision  eye pain  fast, irregular heartbeat  feeling faint or lightheaded, falls  fever or sore throat  gout pain  low blood pressure  muscle pain or cramps  pain, tingling, numbness in the hands or feet  pain or difficulty passing urine  redness, blistering, peeling or loosening of the skin, including inside the mouth  seizures  unusually weak or tired Side effects that usually do not require medical attention (report to your doctor or health care professional if they continue or are bothersome):  change in sex drive or performance  cough  diarrhea  dizziness  dry mouth  flu-like symptoms  headache  stomach upset This list may not describe all possible side effects. Call your doctor for medical advice about side effects. You may report side effects to FDA at 1-800-FDA-1088.  Where should I keep my medicine? Keep out of the reach of children and pets. Store at room temperature between 20 and 25 degrees C (68 and 77 degrees F). Protect from moisture. Keep the container tightly closed. Do not throw out the packet in the container. It keeps the medicine dry. Throw away any unused drug after the expiration date. NOTE: This sheet is a summary.  It may not cover all possible information. If you have questions about this medicine, talk to your doctor, pharmacist, or health care provider.  2021 Elsevier/Gold Standard (2020-01-28 21:57:35)

## 2020-07-28 NOTE — Progress Notes (Signed)
    SUBJECTIVE:   CHIEF COMPLAINT / HPI: Blood pressure follow-up  Monica Li [id number] swahili interpreter was present for this encounter   Hypertension Patient taking amlodipine 10 mg daily.  She reports not taking blood pressures at home. Patient reports that she has been out of her amlodipine for 1 week.  She reports that she was aware of availability of refills however was told by the pharmacist that she needed to check with her physician prior to receiving the refill.  Patient states that she has intermittent headaches, headache is not present at this time.  She denies any chest pain or difficulty breathing.  She denies presence of blurry vision.    Trouble sleeping  Patient reports that she has trouble staying asleep throughout the night.  She reports that she usually sleeps from 5A-12P.  She states that she is not aware of any anxiety provoking topics or changes in her life.  Patient reports that she normally have a warm shower before bed and often watches TV while waiting to fall asleep.  She has not tried melatonin in the past.  Healthcare maintenance Patient due for colonoscopy and influenza vaccination.  PERTINENT  PMH / PSH: Hypertension  OBJECTIVE:   BP (!) 160/108   Pulse 75   Wt 208 lb (94.3 kg)   SpO2 98%   BMI 31.63 kg/m   Blood pressure rechecked: 158/110 Last menstrual period 01/28/2020, premenopausal.   General: Female appearing stated age in no acute distress HEENT: MMM, no oral lesions noted,Neck non-tender without lymphadenopathy Cardio: Normal S1 and S2, no S3 or S4. Rhythm is regular. No murmurs or rubs.  Bilateral radial pulses palpable Pulm: Clear to auscultation bilaterally, no crackles, wheezing, or diminished breath sounds. Normal respiratory effort Extremities: no peripheral edema. Warm & well perfused.  Neuro: pt alert and oriented x4, follows commands, PERRLA, EOMI bilaterally, strength 5/5 in bilateral upper and lower extremities   ASSESSMENT/PLAN:    Primary hypertension Recheck 158/102, normal neurological exam without chest pain or difficulty breathig, no current HA  -will add HCTZ 25mg   -F/u in two weeks,will plan to recheck BMP at that time  - discussed side effects of medication including electrolyte abnormalities -information given on AVS, questions were answered  -refills for amlodipine given, also re-emphasized dietary changes to help lower BP    Insomnia Unclear etiology at this time as to what is contributing to patient's insomnia.  She denies symptoms of depression/anxiety and is not having to frequently get up during the night to urinate.  Recommended starting with improving sleep hygiene practices as well as taking melatonin 1 hour prior to bedtime. We will follow-up on symptoms a next appointment for hypertension Patient given examples of sleepy time tea to try and help with winding down before sleep     , MD [facility]

## 2020-07-30 DIAGNOSIS — G4700 Insomnia, unspecified: Secondary | ICD-10-CM | POA: Insufficient documentation

## 2020-07-30 NOTE — Assessment & Plan Note (Signed)
Recheck 158/102, normal neurological exam without chest pain or difficulty breathig, no current HA  -will add HCTZ 25mg   -F/u in two weeks,will plan to recheck BMP at that time  - discussed side effects of medication including electrolyte abnormalities -information given on AVS, questions were answered  -refills for amlodipine given, also re-emphasized dietary changes to help lower BP

## 2020-07-30 NOTE — Assessment & Plan Note (Addendum)
Unclear etiology at this time as to what is contributing to patient's insomnia.  She denies symptoms of depression/anxiety and is not having to frequently get up during the night to urinate.  Recommended starting with improving sleep hygiene practices as well as taking melatonin 1 hour prior to bedtime. We will follow-up on symptoms a next appointment for hypertension Patient given examples of sleepy time tea to try and help with winding down before sleep

## 2020-08-10 NOTE — Progress Notes (Addendum)
    SUBJECTIVE:   CHIEF COMPLAINT / HPI: Hypertension and insomnia follow-up  Swahili interpreter, Onalee Hua was present for this encounter via iPad  Hypertension Patient reports adherence to amlodipine and hydrochlorothiazide regimen.  She denies any symptoms of hypotension.  She has no headache, chest pain or difficulty breathing.  She denies skipping any doses.  Reemphasized the importance of picking up refills once needed.  Patient verbalized understanding.  Healthcare maintenance Patient states that she received 2 doses of the Covid vaccination already.  States that she would like to receive her influenza vaccine at work.  PERTINENT  PMH / PSH: Hypertension  OBJECTIVE:   BP 110/78   Pulse 80   Ht 5\' 8"  (1.727 m)   Wt 204 lb 3.2 oz (92.6 kg)   SpO2 98%   BMI 31.05 kg/m   General: Female appearing stated age in no acute distress HEENT: MMM, no oral lesions noted,Neck non-tender without lymphadenopathy Cardio: Normal S1 and S2, no S3 or S4. Rhythm is regular. No murmurs or rubs.  Bilateral radial pulses palpable Pulm: Clear to auscultation bilaterally, no crackles, wheezing, or diminished breath sounds. Normal respiratory effort Abdomen: Bowel sounds normal. Abdomen soft and non-tender.  Extremities: No peripheral edema. Warm & well perfused.   ASSESSMENT/PLAN:   Primary hypertension We will repeat BMP today patient had hydrochlorothiazide added to prior appointment. Patient to continue amlodipine hydrochlorothiazide daily. Patient to follow-up in 2 months for blood pressure check We will follow up with any abnormal results of BMP via telephone with swelling interpreter  Healthcare maintenance Patient desires to have influenza vaccine at her job. Patient states that she has received 2 Covid vaccines already, states that she is left her card at home.   F/u 2 months   , MD [facility]   I discussed the plan of care with the  resident physician and agree with below documentation.  [facility], MD

## 2020-08-12 ENCOUNTER — Encounter: Payer: Self-pay | Admitting: Family Medicine

## 2020-08-12 ENCOUNTER — Ambulatory Visit (INDEPENDENT_AMBULATORY_CARE_PROVIDER_SITE_OTHER): Payer: BC Managed Care – PPO | Admitting: Family Medicine

## 2020-08-12 ENCOUNTER — Other Ambulatory Visit: Payer: Self-pay

## 2020-08-12 VITALS — BP 110/78 | HR 80 | Ht 68.0 in | Wt 204.2 lb

## 2020-08-12 DIAGNOSIS — Z0000 Encounter for general adult medical examination without abnormal findings: Secondary | ICD-10-CM | POA: Diagnosis not present

## 2020-08-12 DIAGNOSIS — I10 Essential (primary) hypertension: Secondary | ICD-10-CM

## 2020-08-12 NOTE — Patient Instructions (Signed)
I will call you to let you know if any of your lab work is abnormal.  Please continue take your hydrochlorothiazide and amlodipine daily.

## 2020-08-12 NOTE — Assessment & Plan Note (Signed)
We will repeat BMP today patient had hydrochlorothiazide added to prior appointment. Patient to continue amlodipine hydrochlorothiazide daily. Patient to follow-up in 2 months for blood pressure check We will follow up with any abnormal results of BMP via telephone with swelling interpreter

## 2020-08-12 NOTE — Assessment & Plan Note (Signed)
Patient desires to have influenza vaccine at her job. Patient states that she has received 2 Covid vaccines already, states that she is left her card at home.

## 2020-08-13 LAB — BASIC METABOLIC PANEL
BUN/Creatinine Ratio: 12 (ref 9–23)
BUN: 9 mg/dL (ref 6–24)
CO2: 26 mmol/L (ref 20–29)
Calcium: 9.6 mg/dL (ref 8.7–10.2)
Chloride: 99 mmol/L (ref 96–106)
Creatinine, Ser: 0.78 mg/dL (ref 0.57–1.00)
GFR calc Af Amer: 102 mL/min/{1.73_m2} (ref >59)
GFR calc non Af Amer: 88 mL/min/{1.73_m2} (ref >59)
Glucose: 115 mg/dL — ABNORMAL HIGH (ref 65–99)
Potassium: 3.3 mmol/L — ABNORMAL LOW (ref 3.5–5.2)
Sodium: 143 mmol/L (ref 134–144)

## 2020-08-14 ENCOUNTER — Other Ambulatory Visit: Payer: Self-pay | Admitting: Family Medicine

## 2020-08-14 MED ORDER — POTASSIUM CHLORIDE ER 20 MEQ PO TBCR: 10.0000 meq | EXTENDED_RELEASE_TABLET | Freq: Every day | ORAL | 0 refills | Status: DC

## 2020-08-19 ENCOUNTER — Other Ambulatory Visit: Payer: Self-pay

## 2020-08-19 NOTE — Telephone Encounter (Signed)
Refill sent to pharmacy.   

## 2020-11-16 NOTE — Progress Notes (Deleted)
    SUBJECTIVE:   CHIEF COMPLAINT / HPI: Blood pressure follow-up  Hypertension Patient presents for 60-month blood pressure check after starting amlodipine and hydrochlorothiazide. Patient reports that she has been tolerating her medication. She denies episodes of hypotension***   PERTINENT  PMH / PSH: Hypertension   OBJECTIVE:   There were no vitals taken for this visit.  General: *** appearing stated age in no acute distress HEENT: MMM, no oral lesions noted,Neck non-tender without lymphadenopathy Cardio: Normal S1 and S2, no S3 or S4. Rhythm is regular. No murmurs or rubs.  Bilateral radial pulses palpable Pulm: Clear to auscultation bilaterally, no crackles, wheezing, or diminished breath sounds. Normal respiratory effort Abdomen: Bowel sounds normal. Abdomen soft and non-tender.  Extremities: No peripheral edema. Warm & well perfused.  Neuro: pt alert and oriented x4, follows commands, PERRLA, EOMI bilaterally   ASSESSMENT/PLAN:   No problem-specific Assessment & Plan notes found for this encounter.     Ronnald Ramp, MD [facility]   {    This will disappear when note is signed, click to select method of visit    :1}

## 2020-11-17 ENCOUNTER — Ambulatory Visit: Payer: BC Managed Care – PPO | Admitting: Family Medicine

## 2021-01-20 ENCOUNTER — Ambulatory Visit: Payer: BC Managed Care – PPO | Admitting: Family Medicine

## 2021-02-02 ENCOUNTER — Ambulatory Visit (INDEPENDENT_AMBULATORY_CARE_PROVIDER_SITE_OTHER): Payer: BC Managed Care – PPO | Admitting: Family Medicine

## 2021-02-02 ENCOUNTER — Encounter: Payer: Self-pay | Admitting: Family Medicine

## 2021-02-02 ENCOUNTER — Other Ambulatory Visit: Payer: Self-pay

## 2021-02-02 DIAGNOSIS — I10 Essential (primary) hypertension: Secondary | ICD-10-CM

## 2021-02-02 DIAGNOSIS — H9203 Otalgia, bilateral: Secondary | ICD-10-CM | POA: Diagnosis not present

## 2021-02-02 MED ORDER — BLOOD PRESSURE CUFF MISC: 1.0000 [IU] | Freq: Every day | 0 refills | Status: DC

## 2021-02-02 MED ORDER — AMLODIPINE BESYLATE 10 MG PO TABS: 10.0000 mg | ORAL_TABLET | Freq: Every day | ORAL | 2 refills | Status: DC

## 2021-02-02 MED ORDER — DEBROX 6.5 % OT SOLN: 5.0000 [drp] | Freq: Two times a day (BID) | OTIC | 0 refills | Status: DC

## 2021-02-02 MED ORDER — HYDROCHLOROTHIAZIDE 25 MG PO TABS: 25.0000 mg | ORAL_TABLET | Freq: Every day | ORAL | 2 refills | Status: DC

## 2021-02-02 NOTE — Progress Notes (Signed)
    SUBJECTIVE:   CHIEF COMPLAINT / HPI: HTN f/u   HTN  Patient reports that she has run out of medications for two weeks. She does not have the equipment to check her BP at home.   Ear Burning  Patient reports that she has had burning pain in her left ear. She reports that she has not had any fever or chills. She reports that the burning pain is in her left ear and then it started to burn in her right ear as well. She denies that it has affected her ability to hear. She denies anyone else in the home has had similar issues. She denies presence of discharge from her ear. She denies presence of sore throat. She denies vision changes. She has not noticed any rashes near her ears or face. She reports only getting relief when she cleans her ear with a q-tip.   PERTINENT  PMH / PSH:  HTN  OBJECTIVE:   There were no vitals taken for this visit.  General: female appearing stated age in no acute distress HEENT: MMM, no oral lesions noted,Neck non-tender without lymphadenopathy, masses or thyromegaly, impacted cerumen on right ear  Cardio: Normal S1 and S2, no S3 or S4. Rhythm is regular. No murmurs or rubs.  Bilateral radial pulses palpable Pulm: Clear to auscultation bilaterally, no crackles, wheezing, or diminished breath sounds. Normal respiratory effort, stable on RA Abdomen: Bowel sounds normal. Abdomen soft and non-tender.  Extremities: No peripheral edema. Warm/ well perfused.  Neuro: pt alert and oriented x4    ASSESSMENT/PLAN:   Primary hypertension Patient given new prescriptions with two refills for amlodipine and HCTZ  Patient given handout and explained goal BP readings with Swahili interpreter present  Patient prescribed BP cuff for home use   Ear discomfort, bilateral Swahili interpreter clarified that itching and burning do not necessarily translate directly to swahili. It is likely that patient is describing itching of her ears.  Recommended Debrox drops to help with  impacted cerumen and likely helps with the itching      Ronnald Ramp, MD [facility]

## 2021-02-02 NOTE — Patient Instructions (Addendum)
I have sent a prescription for your blood pressure medications, amlodipine and hydrochlorothiazide.  Please continue to take these on a daily basis.  I have also sent a prescription for blood pressure cuff.  Follow-up for you to check your blood pressure daily with a goal of less than 140/less than 90.     I will also prescribe eardrops to help with the wax buildup in your ears and the burning.   [facility]  Tips to measure your blood pressure correctly  To determine whether you have hypertension, a medical professional will take a blood pressure reading. How you prepare for the test, the position of your arm, and other factors can change a blood pressure reading by 10% or more. That could be enough to hide high blood pressure, start you on a drug you don't really need, or lead your doctor to incorrectly adjust your medications. National and international guidelines offer specific instructions for measuring blood pressure. If a doctor, nurse, or medical assistant isn't doing it right, don't hesitate to ask him or her to get with the guidelines. Here's what you can do to ensure a correct reading:  Don't drink a caffeinated beverage or smoke during the 30 minutes before the test.  Sit quietly for five minutes before the test begins.  During the measurement, sit in a chair with your feet on the floor and your arm supported so your elbow is at about heart level.  The inflatable part of the cuff should completely cover at least 80% of your upper arm, and the cuff should be placed on bare skin, not over a shirt.  Don't talk during the measurement.  Have your blood pressure measured twice, with a brief break in between. If the readings are different by 5 points or more, have it done a third time. There are times to break these rules. If you sometimes feel lightheaded when getting out of bed in the morning or when you stand after sitting, you should have your blood pressure checked while seated and  then while standing to see if it falls from one position to the next. Because blood pressure varies throughout the day, your doctor will rarely diagnose hypertension on the basis of a single reading. Instead, he or she will want to confirm the measurements on at least two occasions, usually within a few weeks of one another. The exception to this rule is if you have a blood pressure reading of 180/110 mm Hg or higher. A result this high usually calls for prompt treatment. It's also a good idea to have your blood pressure measured in both arms at least once, since the reading in one arm (usually the right) may be higher than that in the left. A 2014 study in The American Journal of Medicine of nearly 3,400 people found average arm- to-arm differences in systolic blood pressure of about 5 points. The higher number should be used to make treatment decisions. In 2017, new guidelines from the American Heart Association, the [employer] of Cardiology, and nine other health organizations lowered the diagnosis of high blood pressure to 130/80 mm Hg or higher for all adults. The guidelines also redefined the various blood pressure categories to now include normal, elevated, Stage 1 hypertension, Stage 2 hypertension, and hypertensive crisis (see "Blood pressure categories"). Blood pressure categories  Blood pressure category SYSTOLIC (upper number)  DIASTOLIC (lower number)  Normal Less than 120 mm Hg and Less than 80 mm Hg  Elevated 120-129 mm Hg and Less  than 80 mm Hg  High blood pressure: Stage 1 hypertension 130-139 mm Hg or 80-89 mm Hg  High blood pressure: Stage 2 hypertension 140 mm Hg or higher or 90 mm Hg or higher  Hypertensive crisis (consult your doctor immediately) Higher than 180 mm Hg and/or Higher than 120 mm Hg  Source: American Heart Association and American Stroke Association. For more on getting your blood pressure under control, buy Controlling Your Blood Pressure, a Special Health  Report from [facility].

## 2021-02-03 DIAGNOSIS — H9203 Otalgia, bilateral: Secondary | ICD-10-CM | POA: Insufficient documentation

## 2021-02-03 NOTE — Assessment & Plan Note (Signed)
Swahili interpreter clarified that itching and burning do not necessarily translate directly to swahili. It is likely that patient is describing itching of her ears.  Recommended Debrox drops to help with impacted cerumen and likely helps with the itching

## 2021-02-03 NOTE — Assessment & Plan Note (Signed)
Patient given new prescriptions with two refills for amlodipine and HCTZ  Patient given handout and explained goal BP readings with Swahili interpreter present  Patient prescribed BP cuff for home use

## 2021-03-11 NOTE — Progress Notes (Signed)
    SUBJECTIVE:   CHIEF COMPLAINT / HPI: HTN   Hypertension: - Medications: amlodipine and HCTZ  - Compliance: reports taking only amlodopine  - Checking BP at home: no - Denies any SOB, CP, vision changes, LE edema, medication SEs, or symptoms of hypotension - Diet: no changes - Exercise: none  Abdominal bloating Patient reports that she has been experiencing bloating in her abdomen.  She reports that this is minimally painful but she wonders if she may be pregnant due to this bloating.  She reports that her last menstrual period was in July.  Patient is requesting urine pregnancy test.  She denies any nausea or emesis.  PERTINENT  PMH / PSH:  HTN   OBJECTIVE:   BP (!) 136/95   Pulse 69   Wt 210 lb 3.2 oz (95.3 kg)   SpO2 97%   BMI 31.96 kg/m   General: female appearing stated age in no acute distress Cardio: Normal S1 and S2, no S3 or S4. Rhythm is regular. No murmurs or rubs.  Bilateral radial pulses palpable Pulm: Clear to auscultation bilaterally, no crackles, wheezing, or diminished breath sounds. Normal respiratory effort, stable on RA Abdomen: Bowel sounds normal. Abdomen soft and non-tender.  Extremities: trace peripheral edema. Warm/ well perfused.  Neuro: pt alert and oriented x4   ASSESSMENT/PLAN:   Primary hypertension Patient has only been taking 10mg  amlodipine  Will D/C HCTZ and replace with losartan-hctz combination  Abdominal bloating Patient experiencing abdominal bloating likely due to digestive issues.  We will complete urine pregnancy test as requested by patient. Suspected patient is perimenopausal.  Advised patient on next CDM papaya fruit or supplements to help with bloating.  Patient also requesting MiraLAX, refill submitted for this. Patient scheduled to follow-up on 03/30/2021     04/01/2021, MD [facility]

## 2021-03-12 ENCOUNTER — Ambulatory Visit (INDEPENDENT_AMBULATORY_CARE_PROVIDER_SITE_OTHER): Payer: BC Managed Care – PPO | Admitting: Family Medicine

## 2021-03-12 ENCOUNTER — Encounter: Payer: Self-pay | Admitting: Family Medicine

## 2021-03-12 ENCOUNTER — Other Ambulatory Visit: Payer: Self-pay

## 2021-03-12 VITALS — BP 136/95 | HR 69 | Wt 210.2 lb

## 2021-03-12 DIAGNOSIS — I10 Essential (primary) hypertension: Secondary | ICD-10-CM | POA: Diagnosis not present

## 2021-03-12 DIAGNOSIS — R140 Abdominal distension (gaseous): Secondary | ICD-10-CM | POA: Diagnosis not present

## 2021-03-12 DIAGNOSIS — N912 Amenorrhea, unspecified: Secondary | ICD-10-CM | POA: Diagnosis not present

## 2021-03-12 LAB — POCT URINE PREGNANCY: Preg Test, Ur: NEGATIVE

## 2021-03-12 MED ORDER — LOSARTAN POTASSIUM-HCTZ 50-12.5 MG PO TABS: 1.0000 | ORAL_TABLET | Freq: Every day | ORAL | 1 refills | Status: DC

## 2021-03-12 MED ORDER — POLYETHYLENE GLYCOL 3350 17 GM/SCOOP PO POWD: 17.0000 g | Freq: Every day | ORAL | 2 refills | Status: DC

## 2021-03-12 MED ORDER — AMLODIPINE BESYLATE 10 MG PO TABS: 10.0000 mg | ORAL_TABLET | Freq: Every day | ORAL | Status: DC

## 2021-03-12 NOTE — Assessment & Plan Note (Signed)
Patient has only been taking 10mg  amlodipine  Will D/C HCTZ and replace with losartan-hctz combination

## 2021-03-12 NOTE — Assessment & Plan Note (Signed)
Patient experiencing abdominal bloating likely due to digestive issues.  We will complete urine pregnancy test as requested by patient. Suspected patient is perimenopausal.  Advised patient on next CDM papaya fruit or supplements to help with bloating.  Patient also requesting MiraLAX, refill submitted for this. Patient scheduled to follow-up on 03/30/2021

## 2021-03-12 NOTE — Patient Instructions (Signed)
We will complete a urine pregnancy test today as you have not had a menstrual cycle in two months.   If this test is negative and you are still having bloating, I recommend eating papaya or taking papaya supplements to help with stomach gas and bloating. You can also drink peppermint tea.   For your blood pressure, please take the amlodipine 10mg  and Hyzaar 50-12.5mg  daily.    You are scheduled for an appointment on 03/30/21 at 8:30 AM.

## 2021-03-30 ENCOUNTER — Ambulatory Visit: Payer: BC Managed Care – PPO | Admitting: Family Medicine

## 2021-04-20 ENCOUNTER — Ambulatory Visit (INDEPENDENT_AMBULATORY_CARE_PROVIDER_SITE_OTHER): Payer: BC Managed Care – PPO | Admitting: Family Medicine

## 2021-04-20 ENCOUNTER — Other Ambulatory Visit: Payer: Self-pay

## 2021-04-20 VITALS — BP 130/72 | HR 70 | Wt 207.0 lb

## 2021-04-20 DIAGNOSIS — L299 Pruritus, unspecified: Secondary | ICD-10-CM

## 2021-04-20 MED ORDER — ACETIC ACID 2 % OT SOLN: 4.0000 [drp] | Freq: Four times a day (QID) | OTIC | 0 refills | Status: DC

## 2021-04-20 MED ORDER — AMOXICILLIN-POT CLAVULANATE 500-125 MG PO TABS: 1.0000 | ORAL_TABLET | Freq: Two times a day (BID) | ORAL | 0 refills | Status: AC

## 2021-04-20 NOTE — Patient Instructions (Addendum)
It was great seeing you today!  Today you came in to check on your blood pressure. Since your BP has been running 110-120/80 at home, and today it was 130/72 so Id like you to continue the Amlodpine and Losartan-HCTZ  We also discussed ear itching and your R ear looks to be infected. We are treating with acetic acid 3-5 drops for 7-10 days in R ear and Augmentin twice a day for 7 days.   Please check-out at the front desk before leaving the clinic. Please see your PCP in 2 weeks to follow up on your ear infection, but if you need to be seen earlier than that for any new issues we're happy to fit you in, just give [place] a call!  Please bring all of your medications with you to each visit.    If you haven't already, sign up for My Chart to have easy access to your labs results, and communication with your primary care physician.  Feel free to call with any questions or concerns at any time, at [phone].   Take care,  Dr. Cora Collum [facility]   Ilikuwa nzuri [place] leo!  Illene Bolus kuangalia shinikizo la damu yako. Kwa kuwa BP yako imekuwa ikiendesha 110-120/80 nyumbani, na leo ilikuwa 130/72 kwa hivyo ningependa uendelee Amlodpine na Losartan-HCTZ  Pia tulijadili [place] masikioni na sikio lako la R inaonekana kuwa limeambukizwa. Tunatibu na asidi asetiki matone 3-5 kwa siku 7-10 kwenye sikio la R na Augmentin mara mbili kwa siku kwa siku 7.  Tafadhali angalia kwenye dawati la mbele kabla ya kuondoka kliniki. Edmonia Lynch tazama PCP wako baada ya wiki 2 ili kufuatilia maambukizi ya sikio [place], lakini ikiwa unahitaji [place] mapema zaidi ya hayo kwa masuala yoyote mapya ambayo tuna furaha [place], piga simu tu!  Tafadhali leta dawa zako zote kwa kila ziara.  Ikiwa bado hujafanya hivyo, jiandikishe kwa Chati [place] ili upate [place] rahisi wa matokeo ya [place], na mawasiliano na daktari wako wa huduma ya msingi.  Jisikie huru [place] simu na maswali  au wasiwasi wowote [place], [place] [phone].   Kuwa mwangalifu, Dk. Cora Collum Kituo cha Dawa cha Familia ya Afya ya [facility]

## 2021-04-20 NOTE — Progress Notes (Signed)
    SUBJECTIVE:   CHIEF COMPLAINT / HPI:   Monica Li is a 50 yo who presents  to check on her blood pressure. A swahili interpreter was used via IPAD   BP today 130/72 . Denies CP, SOB, change in vision Current regimen: Amlodipine 10mg   and Losartan-HCTZ 50-12.5 in evening She states BP at home ranges from 110-120/80  Denies physical activity   Additionally, she endorses bilateral ear itching for 1 week. Denies pain, loss of hearing    OBJECTIVE:   BP 130/72   Pulse 70   Wt 207 lb (93.9 kg)   SpO2 98%   BMI 31.47 kg/m    General: alert, NAD HEENT: L TM mildly yellow, non erythematous and without drainage. R TM yellow with hyperpigmented spots. Non erythematous and without visible drainage CV: RRR no murmurs Resp: CTAB normal WOB Derm: no visible rashes or lesions    ASSESSMENT/PLAN:   No problem-specific Assessment & Plan notes found for this encounter.  Bilateral ear pruritis  Patient endorses 1 week of bilateral ear itching without pain or drainage. Denies prior history of ear conditions. On exam L TM with yellow tint, and R TM yellow with hyperpigmented spots. Considering Otomycosis vs tympanosclerosis vs chronic otitis media. Will cover for both fungal and bacterial infection.  - acetic acid 2% 4 drops daily - Augmentin BID for 7 days   HTN  BP today 130/72. Asymptomatic.BP in normal range at home. Current regimen: Amlodipine 10mg   and Losartan-HCTZ 50-12.5 in evening - continue Amlodipine and Losartan-HCTZ  , DO [facility]

## 2021-05-04 ENCOUNTER — Other Ambulatory Visit: Payer: Self-pay

## 2021-05-04 ENCOUNTER — Ambulatory Visit (INDEPENDENT_AMBULATORY_CARE_PROVIDER_SITE_OTHER): Payer: BC Managed Care – PPO | Admitting: Family Medicine

## 2021-05-04 VITALS — BP 120/87 | HR 71 | Ht 68.0 in | Wt 205.4 lb

## 2021-05-04 DIAGNOSIS — Z1211 Encounter for screening for malignant neoplasm of colon: Secondary | ICD-10-CM

## 2021-05-04 DIAGNOSIS — H9203 Otalgia, bilateral: Secondary | ICD-10-CM | POA: Diagnosis not present

## 2021-05-04 DIAGNOSIS — N926 Irregular menstruation, unspecified: Secondary | ICD-10-CM | POA: Diagnosis not present

## 2021-05-04 DIAGNOSIS — E0789 Other specified disorders of thyroid: Secondary | ICD-10-CM | POA: Diagnosis not present

## 2021-05-04 DIAGNOSIS — Z1231 Encounter for screening mammogram for malignant neoplasm of breast: Secondary | ICD-10-CM

## 2021-05-04 DIAGNOSIS — I10 Essential (primary) hypertension: Secondary | ICD-10-CM | POA: Diagnosis not present

## 2021-05-04 LAB — POCT URINE PREGNANCY: Preg Test, Ur: NEGATIVE

## 2021-05-04 MED ORDER — ACETIC ACID 2 % OT SOLN: 4.0000 [drp] | Freq: Three times a day (TID) | OTIC | 0 refills | Status: DC

## 2021-05-04 NOTE — Progress Notes (Signed)
SUBJECTIVE:   CHIEF COMPLAINT / HPI:   Swahili video interpreter used for entirety of visit:   Ear discomfort, possible infection - s/p 1 week augmentin  - s/p acetic acid 2% 4 drops daily - no improvement with drops - still feels like there's fluid behind her ears - is not finished with the augmentin, still has "a lot" of pills left - she reports that every time she uses the ear drops, she takes an augmentin pill - ear drops 4 drops in right ear only at morning and at night - difficult to hear, has some constant noise "like someone is showering"  [facility] visit 04/20/21, provider notes below: H&P Additionally, she endorses bilateral ear itching for 1 week. Denies pain, loss of hearing  Physical exam HEENT: L TM mildly yellow, non erythematous and without drainage. R TM yellow with hyperpigmented spots. Non erythematous and without visible drainage A&P Bilateral ear pruritis  Patient endorses 1 week of bilateral ear itching without pain or drainage. Denies prior history of ear conditions. On exam L TM with yellow tint, and R TM yellow with hyperpigmented spots. Considering Otomycosis vs tympanosclerosis vs chronic otitis media. Will cover for both fungal and bacterial infection.  - acetic acid 2% 4 drops daily - Augmentin BID for 7 days   Blood pressure - normal today - BP regimen amlodipine 10 mg and losartan-HCTZ 50/12.5 mg qHS - reports some heart racing in the morning, intermittently - improves throughout day  Missed menses - negative urine pregnancy test 03/12/21 - previous provider discussed likely onset of menopause with patient - LMP in August - is currently sexually active - has had intercourse since last LMP  Lump on chest - left superior clavicular area - would like to be checked out - swelling x 5 days - never happened before - no pain - no fever, chills, or other systemic symptoms - no noticeable weight loss - no night sweats - no changes in bowel  habits - no SOB - some intermittent coughing, but non productive, no hemoptysis  - no night sweats   PERTINENT  PMH / PSH: HTN, prediabetes, lower abdominal pain, knee pain, bilateral ear discomfort  OBJECTIVE:   BP 120/87   Pulse 71   Ht 5\' 8"  (1.727 m)   Wt 205 lb 6 oz (93.2 kg)   LMP 02/02/2021   SpO2 100%   BMI 31.23 kg/m    PHQ-9:  Depression screen [facility] 2/9 04/20/2021 03/12/2021 08/12/2020  Decreased Interest 0 0 0  Down, Depressed, Hopeless 0 0 0  PHQ - 2 Score 0 0 0  Altered sleeping - 0 0  Tired, decreased energy - 0 0  Change in appetite - 0 0  Feeling bad or failure about yourself  - 0 0  Trouble concentrating - 0 0  Moving slowly or fidgety/restless - 0 0  Suicidal thoughts - 0 0  PHQ-9 Score - 0 0  Difficult doing work/chores - - -     GAD-7: No flowsheet data found.  Physical Exam General: Awake, alert, oriented HEENT: PERRL, R TM covered in dry yellow crust with erythematous external canal, L TM pearly pink and flat, L external auditory canals with minimal cerumen burden and no lesions, nasal mucosa slightly edematous, oral mucosa pink and moist, thyroid appears full to palpation but without nodularity, no "lump" over left medial clavicle where patient feels it Lymph: No palpable lymphedema of head or neck Cardiovascular: Regular rate and rhythm, S1 and S2  present, no murmurs auscultated Respiratory: Lung fields clear to auscultation bilaterally  ASSESSMENT/PLAN:   Thyroid fullness New, unclear if worsening. Patient c/o neck lump and points to medial left clavicle; however that area appears normal on palpation. What is abnormal is global thyroid fullness on palpation and swallow. No nodularity appreciated. No red flag symptoms. Will send for TSH and thyroid US. Suspect thyroiditis, less likely thyroid nodule or malignancy.   Ear discomfort, bilateral Stable, no improvement. Patient has not finished augmentin, still has many pills left in bottle. Educated on  how to take until script complete. Patient was also only using ear drops unilaterally. Will prescribe more acetic acid drops to be used bilaterally QID. Return precautions discussed. See AVS.   Primary hypertension Stable, at goal. Tolerating meds well. No changes to medication at this time.   Missed menses Negative urine pregnancy test. Counseled on developing menopause.      Fayette Pho, MD [facility]

## 2021-05-04 NOTE — Patient Instructions (Addendum)
I have translated the following text using Google translate.  As such, there are many errors.  I apologize for the poor written translation; however, we do not have written  translation services yet.  Nimetafsiri maandishi yafuatayo kwa kutumia Google translate. Kwa hivyo, [employer] Naomba radhi kwa tafsiri mbovu iliyoandikwa; hata hivyo, bado hatuna huduma za utafsiri zilizoandikwa.  It was wonderful to meet you today. Thank you for allowing me to be a part of your care. Below is a short summary of what we discussed at your visit today: Ilikuwa nzuri [place] nawe leo. Asante kwa kuniruhusu kuwa sehemu ya utunzaji wako. Ufuatao ni muhtasari mfupi wa tulichojadili kwenye ziara yako leo:  Ear infection  - continue taking your augmentin pills twice per day until all the pills are gone - use the ear drops three times per day in both ears - come back if it is not improving [place] ya sikio - endelea kumeza vidonge vyako vya augmentin mara mbili kwa siku hadi tembe zote ziishe - tumia matone ya sikio mara tatu kwa siku katika masikio yote mawili - kurudi ikiwa haiboresha  Health Maintenance We like to think about ways to keep you healthy for years to come. Below are some interventions and screenings we can offer to keep you healthy: - colonoscopy to screen for colon cancer (the gastroenterologist or stomach doctor office will call you directly for an appointment) - mammogram to screen for breast cancer (you will need to call the mammogram center to schedule an appointment) -  Shingles vaccines to protect against singles rash (we can send the prescription to your pharmacy and they will give you the shot) Matengenezo ya Afya [place] Jillyn Hidden juu ya njia za kukuweka afya kwa miaka ijayo. Zifuatazo ni baadhi ya hatua na uchunguzi tunaoweza kutoa ili kuwa na afya njema: - colonoscopy kuchunguza saratani Alveta Heimlich [phone] wa gastroenterologist au ofisi ya daktari wa tumbo itakupigia simu  moja kwa moja kwa miadi) - mammografia kupima saratani ya matiti (utahitaji [place] simu kituo cha mammogram ili kupanga miadi) - [place] ya vipele kulinda dhidi ya upele wa mtu mmoja (tunaweza kutuma maagizo [place] duka lako la dawa na watakupatia picha)  Please bring all of your medications to every appointment! Tafadhali leta dawa zako zote kwa kila miadi!  If you have any questions or concerns, please do not hesitate to contact us via phone or MyChart message.  Lavonna Monarch una maswali au wasiwasi wowote, tafadhali usisite kuwasiliana nasi kupitia simu au ujumbe wa MyChart.  Fayette Pho, MD

## 2021-05-05 ENCOUNTER — Telehealth: Payer: Self-pay

## 2021-05-05 LAB — TSH: TSH: 0.7 u[IU]/mL (ref 0.450–4.500)

## 2021-05-05 NOTE — Telephone Encounter (Signed)
Spoke with patients husband through [employer] [phone]. Informed of [place] appt at [facility] on [place] 11/3 at 2:15p. Patients husband understood and said he will  let the patient know. Aquilla Solian, CMA

## 2021-05-07 ENCOUNTER — Ambulatory Visit (HOSPITAL_COMMUNITY): Admission: RE | Admit: 2021-05-07 | Payer: BC Managed Care – PPO | Source: Ambulatory Visit

## 2021-05-07 NOTE — Progress Notes (Signed)
I was available as preceptor for this patient's visit.

## 2021-05-08 DIAGNOSIS — E0789 Other specified disorders of thyroid: Secondary | ICD-10-CM | POA: Insufficient documentation

## 2021-05-08 NOTE — Assessment & Plan Note (Signed)
New, unclear if worsening. Patient c/o neck lump and points to medial left clavicle; however that area appears normal on palpation. What is abnormal is global thyroid fullness on palpation and swallow. No nodularity appreciated. No red flag symptoms. Will send for TSH and thyroid US. Suspect thyroiditis, less likely thyroid nodule or malignancy.

## 2021-05-08 NOTE — Assessment & Plan Note (Signed)
Negative urine pregnancy test. Counseled on developing menopause.

## 2021-05-08 NOTE — Assessment & Plan Note (Signed)
Stable, at goal. Tolerating meds well. No changes to medication at this time.

## 2021-05-08 NOTE — Assessment & Plan Note (Signed)
Stable, no improvement. Patient has not finished augmentin, still has many pills left in bottle. Educated on how to take until script complete. Patient was also only using ear drops unilaterally. Will prescribe more acetic acid drops to be used bilaterally QID. Return precautions discussed. See AVS.

## 2021-05-15 ENCOUNTER — Other Ambulatory Visit: Payer: Self-pay

## 2021-05-15 ENCOUNTER — Ambulatory Visit (HOSPITAL_COMMUNITY)
Admission: RE | Admit: 2021-05-15 | Discharge: 2021-05-15 | Disposition: A | Discharge status: Home / Self Care | Payer: BC Managed Care – PPO | Source: Ambulatory Visit | Attending: Family Medicine | Admitting: Family Medicine

## 2021-05-15 DIAGNOSIS — E041 Nontoxic single thyroid nodule: Secondary | ICD-10-CM | POA: Diagnosis not present

## 2021-05-15 DIAGNOSIS — E0789 Other specified disorders of thyroid: Secondary | ICD-10-CM | POA: Insufficient documentation

## 2021-05-19 ENCOUNTER — Other Ambulatory Visit: Payer: Self-pay | Admitting: Family Medicine

## 2021-05-19 DIAGNOSIS — E0789 Other specified disorders of thyroid: Secondary | ICD-10-CM

## 2021-05-19 DIAGNOSIS — E041 Nontoxic single thyroid nodule: Secondary | ICD-10-CM

## 2021-05-19 NOTE — Progress Notes (Signed)
Mildly suspicious nodule in mid right thyroid as seen on ultrasound. Requires FNA. Order placed here in this encounter. Will forward to [facility] RN pool for scheduling at [facility].   Fayette Pho, MD

## 2021-05-26 NOTE — Progress Notes (Signed)
Upon chart review, patient was scheduled for 06/18/2021 at 3:15.  Veronda Prude, RN

## 2021-06-15 ENCOUNTER — Other Ambulatory Visit: Payer: Self-pay

## 2021-06-15 MED ORDER — LOSARTAN POTASSIUM-HCTZ 50-12.5 MG PO TABS: 1.0000 | ORAL_TABLET | Freq: Every day | ORAL | 1 refills | Status: DC

## 2021-06-15 MED ORDER — AMLODIPINE BESYLATE 10 MG PO TABS: 10.0000 mg | ORAL_TABLET | Freq: Every day | ORAL | 1 refills | Status: DC

## 2021-06-18 ENCOUNTER — Other Ambulatory Visit (HOSPITAL_COMMUNITY)
Admission: RE | Admit: 2021-06-18 | Discharge: 2021-06-18 | Disposition: A | Discharge status: Home / Self Care | Payer: BC Managed Care – PPO | Source: Ambulatory Visit | Attending: Radiology | Admitting: Radiology

## 2021-06-18 ENCOUNTER — Inpatient Hospital Stay: Admission: RE | Admit: 2021-06-18 | Payer: BC Managed Care – PPO | Source: Ambulatory Visit

## 2021-06-18 ENCOUNTER — Ambulatory Visit
Admission: RE | Admit: 2021-06-18 | Discharge: 2021-06-18 | Disposition: A | Discharge status: Home / Self Care | Payer: BC Managed Care – PPO | Source: Ambulatory Visit | Attending: Family Medicine | Admitting: Family Medicine

## 2021-06-18 DIAGNOSIS — E041 Nontoxic single thyroid nodule: Secondary | ICD-10-CM | POA: Diagnosis present

## 2021-06-18 DIAGNOSIS — E0789 Other specified disorders of thyroid: Secondary | ICD-10-CM

## 2021-06-22 LAB — CYTOLOGY - NON PAP

## 2021-06-25 ENCOUNTER — Telehealth: Payer: Self-pay | Admitting: Family Medicine

## 2021-06-25 DIAGNOSIS — R140 Abdominal distension (gaseous): Secondary | ICD-10-CM

## 2021-06-25 DIAGNOSIS — G4700 Insomnia, unspecified: Secondary | ICD-10-CM

## 2021-06-25 DIAGNOSIS — R635 Abnormal weight gain: Secondary | ICD-10-CM

## 2021-06-25 DIAGNOSIS — E0789 Other specified disorders of thyroid: Secondary | ICD-10-CM

## 2021-06-25 NOTE — Telephone Encounter (Signed)
Called patient to discuss FNA results and need for referral to Dr. Gerrit Friends, endo surgery at [facility]. Utilized phone Swahili interpreter [phone] Onalee Hua. No answer, left HIPPA-safe VM. Order for referral to endo surgery Dr. Gerrit Friends placed in this encounter.   Fayette Pho, MD

## 2021-07-07 ENCOUNTER — Ambulatory Visit: Payer: BC Managed Care – PPO

## 2021-07-14 ENCOUNTER — Other Ambulatory Visit: Payer: BC Managed Care – PPO

## 2021-07-21 ENCOUNTER — Other Ambulatory Visit: Payer: Self-pay | Admitting: Surgery

## 2021-07-21 DIAGNOSIS — E041 Nontoxic single thyroid nodule: Secondary | ICD-10-CM

## 2021-08-06 ENCOUNTER — Ambulatory Visit
Admission: RE | Admit: 2021-08-06 | Discharge: 2021-08-06 | Disposition: A | Discharge status: Home / Self Care | Payer: BC Managed Care – PPO | Source: Ambulatory Visit | Attending: Surgery | Admitting: Surgery

## 2021-08-06 ENCOUNTER — Other Ambulatory Visit (HOSPITAL_COMMUNITY)
Admission: RE | Admit: 2021-08-06 | Discharge: 2021-08-06 | Disposition: A | Discharge status: Home / Self Care | Payer: BC Managed Care – PPO | Source: Ambulatory Visit | Attending: Surgery | Admitting: Surgery

## 2021-08-06 ENCOUNTER — Encounter: Payer: Self-pay | Admitting: Family Medicine

## 2021-08-06 DIAGNOSIS — E041 Nontoxic single thyroid nodule: Secondary | ICD-10-CM

## 2021-08-10 LAB — CYTOLOGY - NON PAP

## 2021-08-12 NOTE — Progress Notes (Signed)
FNA biopsy is benign.  Recommend follow up with me in one year with an USN and TSH level prior to her visit.  We discussed this at her last visit with a translator.  Return for follow up one year.  Tresa Endo - please arrange.  tmg  Darnell Level, MD [facility] practice Office: [phone]

## 2021-08-18 DIAGNOSIS — Z1211 Encounter for screening for malignant neoplasm of colon: Secondary | ICD-10-CM | POA: Diagnosis not present

## 2021-08-18 DIAGNOSIS — Z79899 Other long term (current) drug therapy: Secondary | ICD-10-CM | POA: Diagnosis not present

## 2021-08-19 ENCOUNTER — Other Ambulatory Visit: Payer: Self-pay

## 2021-08-19 ENCOUNTER — Ambulatory Visit (INDEPENDENT_AMBULATORY_CARE_PROVIDER_SITE_OTHER): Payer: BC Managed Care – PPO | Admitting: Family Medicine

## 2021-08-19 ENCOUNTER — Encounter: Payer: Self-pay | Admitting: Family Medicine

## 2021-08-19 VITALS — BP 122/60 | HR 61 | Temp 98.1°F | Ht 68.0 in | Wt 204.2 lb

## 2021-08-19 DIAGNOSIS — I10 Essential (primary) hypertension: Secondary | ICD-10-CM

## 2021-08-19 NOTE — Patient Instructions (Addendum)
Asante kwa kuja leo. Shinikizo lako la damu ni zuri leo.  Hatutaanza tena dawa zako leo.  Si Raider wiki 2 kwa ajili ya recheck nyingine ya shinikizo la damu.  Kama una shinikizo la damu zaidi ya 140/90 tafadhali piga simu na tujulishe.  Gardiner Fanti na maumivu ya [place], mabadiliko ya [place], maumivu ya [place], [place] pumzi tujulishe.  Angalia shinikizo lako la damu kila siku na urekodi hii na uilete kwenye ziara yako inayofuata.  Thank you for coming in today. Your blood pressure is good today.  We will not restart your medications today.  Follow-up in 2 weeks for another blood pressure recheck.  If you have blood pressures over 140/90 please call and let us know.  If you start to have headaches, vision changes, chest pain, shortness of breath let us know.  Check your blood pressure daily and record this and bring it to your next visit.  Dr. Salvadore Dom

## 2021-08-19 NOTE — Progress Notes (Signed)
° ° °  SUBJECTIVE:   CHIEF COMPLAINT / HPI:   Hypertension, follow-up - Medications: amlodipine 10 mg daily, hyzaar 50-12.5 mg daily  - Compliance: Out of medications for 1 week; needs refills - Checking BP at home: Has cuff at home - Denies any SOB, CP, vision changes, LE edema, medication SEs, or symptoms of hypotension  PERTINENT  PMH / PSH: Language barrier (Swahili interpreter used the entire encounter), prediabetes  OBJECTIVE:   BP 122/60    Pulse 61    Temp 98.1 F (36.7 C)    Ht 5\' 8"  (1.727 m)    Wt 204 lb 3.2 oz (92.6 kg)    SpO2 97%    BMI 31.05 kg/m  Physical Exam Vitals reviewed.  Constitutional:      General: She is not in acute distress.    Appearance: She is not ill-appearing, toxic-appearing or diaphoretic.  Cardiovascular:     Heart sounds: Normal heart sounds.  Pulmonary:     Effort: Pulmonary effort is normal.     Breath sounds: Normal breath sounds.  Neurological:     Mental Status: She is alert and oriented to person, place, and time.  Psychiatric:        Mood and Affect: Mood normal.        Behavior: Behavior normal.   ASSESSMENT/PLAN:   Primary hypertension Well controlled. Off medication for 1 week. Will not refill medications today. Encouraged to check BPs with goal of less than 140/90, or give a call. Bring log to 2 week follow up.    , DO [facility]

## 2021-09-02 ENCOUNTER — Ambulatory Visit: Payer: BC Managed Care – PPO | Admitting: Student

## 2021-09-02 NOTE — Patient Instructions (Incomplete)
It was great to see you! Thank you for allowing me to participate in your care! ? ?I recommend that you always bring your medications to each appointment as this makes it easy to ensure we are on the correct medications and helps [place] not miss when refills are needed. ? ?Our plans for today:  ?- Monitor blood pressure ?-  ? ?We are checking some labs today, I will call you if they are abnormal will send you a MyChart message or a letter if they are normal.  If you do not hear about your labs in the next 2 weeks please let us know.*** ? ?Take care and seek immediate care sooner if you develop any concerns.  ? ?Dr. Bess Kinds, MD ?[facility] ? ?

## 2021-09-02 NOTE — Progress Notes (Deleted)
?  SUBJECTIVE:  ? ?CHIEF COMPLAINT / HPI:  ? ?Patient off meds since week of 08/12/21. Last bp in clinic was normal. Patient encouraged to keep a log and return for BP check.  ?BP today ?There were no vitals filed for this visit. ? ?Medications: amlodipine 10 mg daily, hyzaar 50-12.5 mg daily  ?- Compliance: Out of medications for 3 week ?- Checking BP at home: Has cuff at home ?- Denies any SOB, CP, vision changes, LE edema, medication SEs, or symptoms of hypotension ? ?Colonoscopy? ? ?Covid/Shingrix ? ? ?PERTINENT  PMH / PSH: HTN, prediabetes, language barrier ? ?Past Medical History:  ?Diagnosis Date  ? Hypertension   ? ? ?No past surgical history on file. ? ?OBJECTIVE:  ?There were no vitals taken for this visit. ? ?General: NAD, pleasant, able to participate in exam ?Cardiac: RRR, no murmurs auscultated. ?Respiratory: CTAB, normal effort, no wheezes, rales or rhonchi ?Abdomen: soft, non-tender, non-distended, normoactive bowel sounds ?Extremities: warm and well perfused, no edema or cyanosis. ?Skin: warm and dry, no rashes noted ?Neuro: alert, no obvious focal deficits, speech normal ?Psych: Normal affect and mood ? ?ASSESSMENT/PLAN:  ?No problem-specific Assessment & Plan notes found for this encounter. ?  ?No orders of the defined types were placed in this encounter. ? ?No orders of the defined types were placed in this encounter. ? ?No follow-ups on file. ?@SIGNNOTE @ ?{    This will disappear when note is signed, click to select method of visit    :1} ?

## 2021-09-04 ENCOUNTER — Telehealth: Payer: Self-pay

## 2021-09-04 NOTE — Telephone Encounter (Signed)
Transition Care Management Follow-up Telephone Call ?Date of discharge and from where: 09/03/2021 from [facility] ?How have you been since you were released from the hospital? Spoke with patient and patient son. They stated that patient feet is still hurting. No questions regarding information given from [facility].  ?Any questions or concerns? No ? ?Items Reviewed: ?Did the pt receive and understand the discharge instructions provided? Yes  ?Medications obtained and verified? Yes  ?Other? No  ?Any new allergies since your discharge? No  ?Dietary orders reviewed? No ?Do you have support at home? Yes  ? ?Functional Questionnaire: (I = Independent and D = Dependent) ?ADLs: I ? ?Bathing/Dressing- I ? ?Meal Prep- I ? ?Eating- I ? ?Maintaining continence- I ? ?Transferring/Ambulation- I ? ?Managing Meds- I ? ? ?Follow up appointments reviewed: ? ?PCP Hospital f/u appt confirmed? No  Recommended follow up with PCP if patient's pain persists.  ?[facility] f/u appt confirmed? No   ?Are transportation arrangements needed? No  ?If their condition worsens, is the pt aware to call PCP or go to the Emergency Dept.? Yes ?Was the patient provided with contact information for the PCP's office or ED? Yes ?Was to pt encouraged to call back with questions or concerns? Yes ? ?

## 2021-09-28 DIAGNOSIS — Z1211 Encounter for screening for malignant neoplasm of colon: Secondary | ICD-10-CM | POA: Diagnosis not present

## 2021-10-10 DIAGNOSIS — K635 Polyp of colon: Secondary | ICD-10-CM | POA: Diagnosis not present

## 2021-11-13 DIAGNOSIS — M545 Low back pain, unspecified: Secondary | ICD-10-CM | POA: Diagnosis not present

## 2021-11-13 DIAGNOSIS — K648 Other hemorrhoids: Secondary | ICD-10-CM | POA: Diagnosis not present

## 2021-11-13 DIAGNOSIS — Z86010 Personal history of colonic polyps: Secondary | ICD-10-CM | POA: Diagnosis not present

## 2021-12-07 ENCOUNTER — Ambulatory Visit: Payer: BC Managed Care – PPO | Admitting: Family Medicine

## 2021-12-13 NOTE — Progress Notes (Addendum)
    SUBJECTIVE:   CHIEF COMPLAINT / HPI: HTN f/u   Hypertension: - Medications: hyzaar 50-12.5 and amlodipine 10mg   Patient was last evaluated in February 2023. BP was well controlled after being without medications for 1 week so the above medications were not restarted. Today she states she has been taking her medications regularly but has been out for one week.    - Checking BP at home: yes but states that her machine is no longer working   - Denies any SOB, CP, vision changes, LE edema, medication SEs, or symptoms of hypotension  Ear pruritis  She has been using drops at night before sleep to help with itching in her ear  She uses them intermittently She reports that her ears are itchy today  She denies having ear pain or hearing abnormal sounds  She is requesting refills on acetic acid ear drops    PERTINENT  PMH / PSH:  HTN   OBJECTIVE:   BP (!) 148/94   Pulse 84   Ht 5\' 8"  (1.727 m)   Wt 205 lb (93 kg)   SpO2 98%   BMI 31.17 kg/m   Physical Exam Constitutional:      General: She is not in acute distress.    Appearance: Normal appearance. She is not ill-appearing.  HENT:     Head:     Comments: Cotton ball fibers noted in ear canal      Right Ear: External ear normal. No drainage or tenderness. A foreign body is present.     Left Ear: External ear normal. No drainage or tenderness.  No middle ear effusion. No hemotympanum. Tympanic membrane is not erythematous.     Nose:     Right Turbinates: Enlarged.     Left Turbinates: Enlarged.     Mouth/Throat:     Mouth: Mucous membranes are moist.  Eyes:     General:        Right eye: No discharge.        Left eye: No discharge.     Conjunctiva/sclera: Conjunctivae normal.  Cardiovascular:     Rate and Rhythm: Normal rate and regular rhythm.     Pulses: Normal pulses.     Heart sounds: Normal heart sounds.  Pulmonary:     Effort: Pulmonary effort is normal.     Breath sounds: Normal breath sounds. No stridor. No  wheezing, rhonchi or rales.  Neurological:     Mental Status: She is alert.      ASSESSMENT/PLAN:   Primary hypertension We will check labs today Patient is to continue amlodipine Control appointment Encourage patient to check blood pressure at home, given handout to help patient with establishing normal blood pressure parameters We will follow-up in 1 week for blood pressure recheck Blood pressure elevated to 160/90, improved recheck    Ear itching Exam is not consistent with otitis media nor externa We will encourage patient to stop the acetic acid drops and replace with Flonase daily      March 2023, MD [facility]

## 2021-12-14 ENCOUNTER — Ambulatory Visit (INDEPENDENT_AMBULATORY_CARE_PROVIDER_SITE_OTHER): Payer: BC Managed Care – PPO | Admitting: Family Medicine

## 2021-12-14 ENCOUNTER — Encounter: Payer: Self-pay | Admitting: Family Medicine

## 2021-12-14 VITALS — BP 148/94 | HR 84 | Ht 68.0 in | Wt 205.0 lb

## 2021-12-14 DIAGNOSIS — I10 Essential (primary) hypertension: Secondary | ICD-10-CM | POA: Diagnosis not present

## 2021-12-14 DIAGNOSIS — L299 Pruritus, unspecified: Secondary | ICD-10-CM | POA: Diagnosis not present

## 2021-12-14 MED ORDER — AMLODIPINE BESYLATE 10 MG PO TABS: 10.0000 mg | ORAL_TABLET | Freq: Every day | ORAL | 1 refills | Status: DC

## 2021-12-14 MED ORDER — FLUTICASONE PROPIONATE 50 MCG/ACT NA SUSP: 2.0000 | Freq: Every day | NASAL | 6 refills | Status: DC

## 2021-12-14 MED ORDER — LOSARTAN POTASSIUM-HCTZ 50-12.5 MG PO TABS: 1.0000 | ORAL_TABLET | Freq: Every day | ORAL | 1 refills | Status: DC

## 2021-12-14 NOTE — Patient Instructions (Signed)
I have provided refills for your 2 blood pressure medications.  Please take these daily.  I recommend that you also check your blood pressure at home.  Please see below for normal blood pressures.  [employer]  Tips to measure your blood pressure correctly  To determine whether you have hypertension, a medical professional will take a blood pressure reading. How you prepare for the test, the position of your arm, and other factors can change a blood pressure reading by 10% or more. That could be enough to hide high blood pressure, start you on a drug you don't really need, or lead your doctor to incorrectly adjust your medications. National and international guidelines offer specific instructions for measuring blood pressure. If a doctor, nurse, or medical assistant isn't doing it right, don't hesitate to ask him or her to get with the guidelines. Here's what you can do to ensure a correct reading:  Don't drink a caffeinated beverage or smoke during the 30 minutes before the test.  Sit quietly for five minutes before the test begins.  During the measurement, sit in a chair with your feet on the floor and your arm supported so your elbow is at about heart level.  The inflatable part of the cuff should completely cover at least 80% of your upper arm, and the cuff should be placed on bare skin, not over a shirt.  Don't talk during the measurement.  Have your blood pressure measured twice, with a brief break in between. If the readings are different by 5 points or more, have it done a third time.  Blood pressure categories  Blood pressure category SYSTOLIC (upper number)  DIASTOLIC (lower number)  Normal Less than 120 mm Hg and Less than 80 mm Hg  Elevated 120-129 mm Hg and Less than 80 mm Hg  High blood pressure: Stage 1 hypertension 130-139 mm Hg or 80-89 mm Hg  High blood pressure: Stage 2 hypertension 140 mm Hg or higher or 90 mm Hg or higher  Hypertensive crisis (consult your doctor  immediately) Higher than 180 mm Hg and/or Higher than 120 mm Hg  Source: American Heart Association and American Stroke Association. For more on getting your blood pressure under control, buy Controlling Your Blood Pressure, a Special Health Report from [facility].

## 2021-12-14 NOTE — Assessment & Plan Note (Addendum)
Exam is not consistent with otitis media nor externa We will encourage patient to stop the acetic acid drops and replace with Flonase daily

## 2021-12-14 NOTE — Assessment & Plan Note (Signed)
We will check labs today Patient is to continue amlodipine Control appointment Encourage patient to check blood pressure at home, given handout to help patient with establishing normal blood pressure parameters We will follow-up in 1 week for blood pressure recheck Blood pressure elevated to 160/90, improved recheck

## 2021-12-15 LAB — BASIC METABOLIC PANEL
BUN/Creatinine Ratio: 17 (ref 9–23)
BUN: 13 mg/dL (ref 6–24)
CO2: 24 mmol/L (ref 20–29)
Calcium: 9.4 mg/dL (ref 8.7–10.2)
Chloride: 103 mmol/L (ref 96–106)
Creatinine, Ser: 0.75 mg/dL (ref 0.57–1.00)
Glucose: 99 mg/dL (ref 70–99)
Potassium: 3.7 mmol/L (ref 3.5–5.2)
Sodium: 142 mmol/L (ref 134–144)
eGFR: 96 mL/min/{1.73_m2} (ref >59)

## 2021-12-16 ENCOUNTER — Encounter: Payer: Self-pay | Admitting: Family Medicine

## 2021-12-24 ENCOUNTER — Ambulatory Visit: Payer: BC Managed Care – PPO | Admitting: Family Medicine

## 2022-01-17 IMAGING — US US THYROID
1 series · 15 of 25 positions shown · non-contrast
Comparison: None.

CLINICAL DATA: Thyroid fullness

TSH within normal limits
EXAM:
THYROID ULTRASOUND
TECHNIQUE: Ultrasound examination of the thyroid gland and adjacent soft
tissues was performed.

[Series 1: us thyroid mc & wl · 15 of 25 slices shown]
[im 1/25]
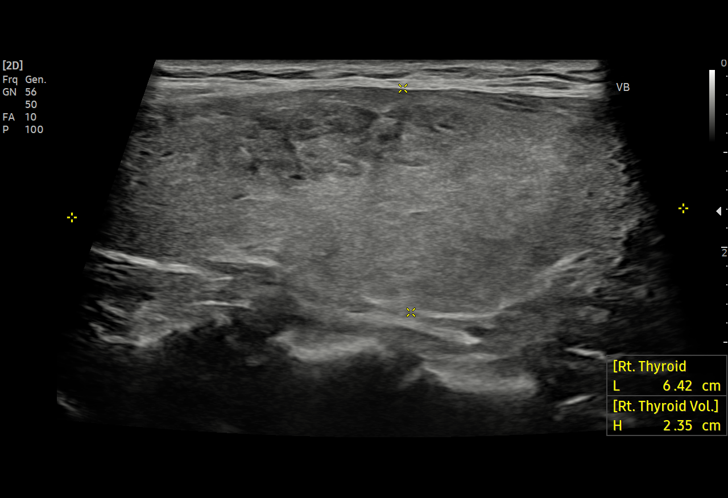
[im 3/25]
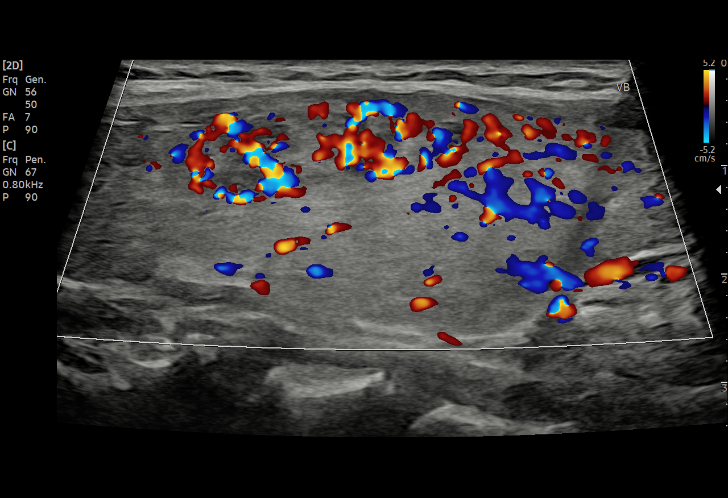
[im 5/25]
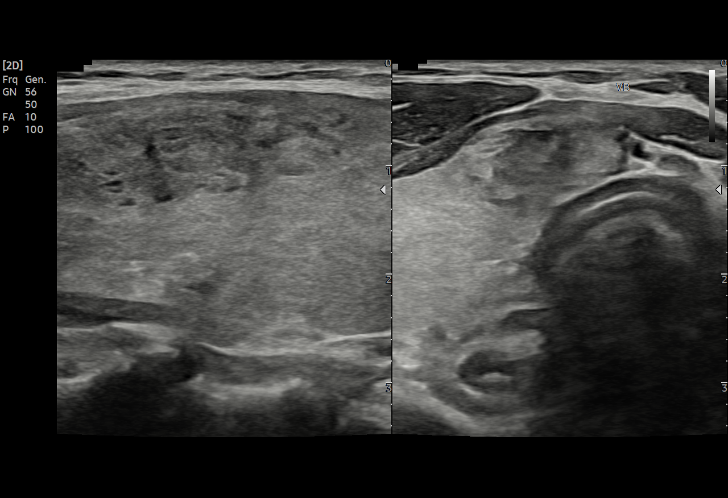
[im 6/25]
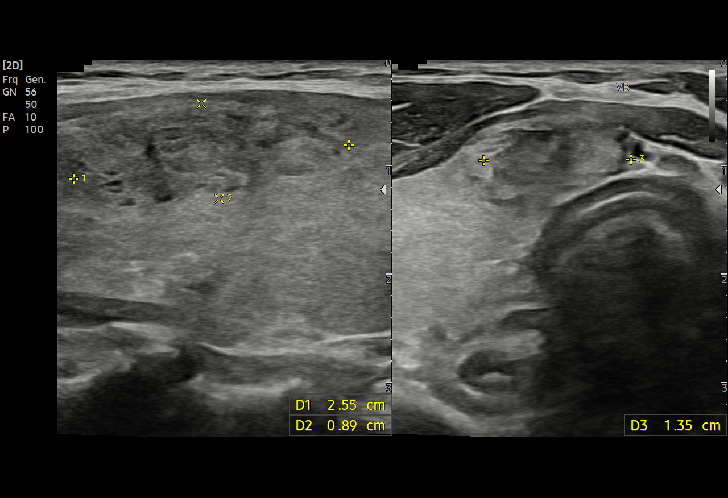
[im 8/25]
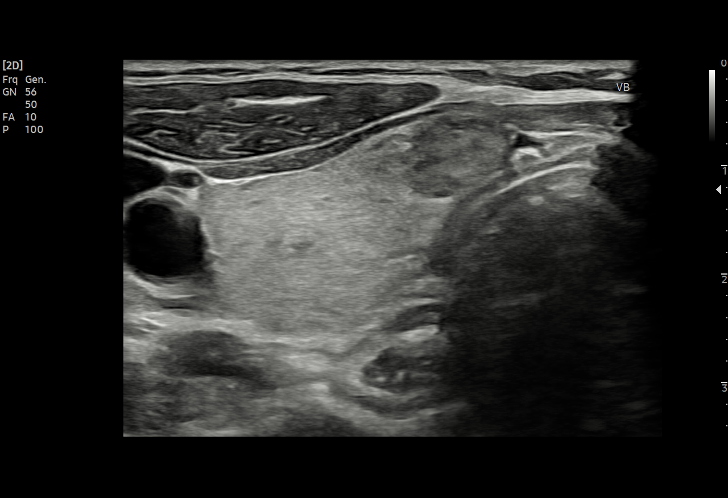
[im 10/25]
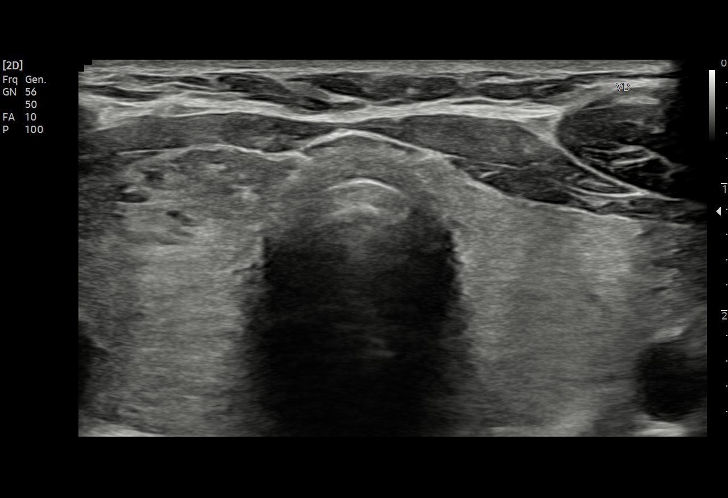
[im 11/25]
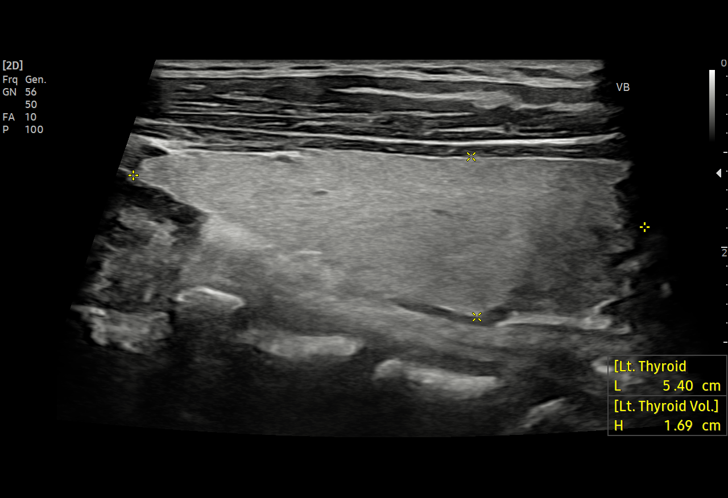
[im 13/25]
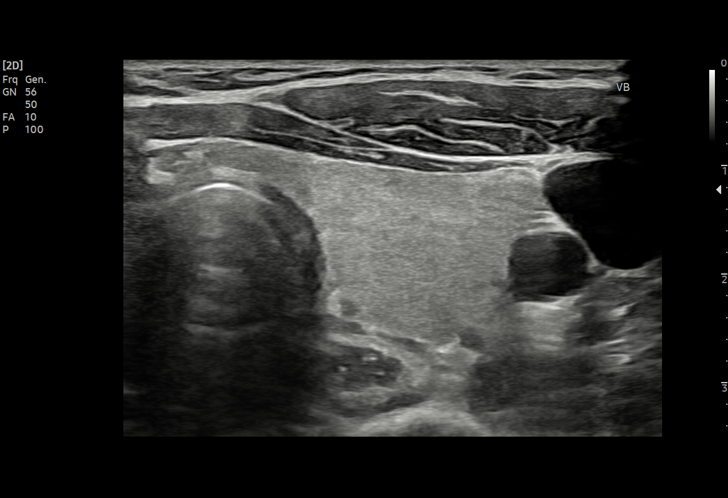
[im 15/25]
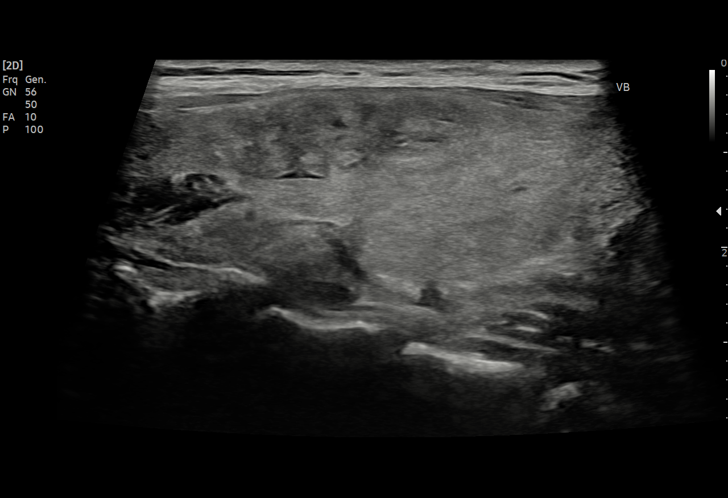
[im 16/25]
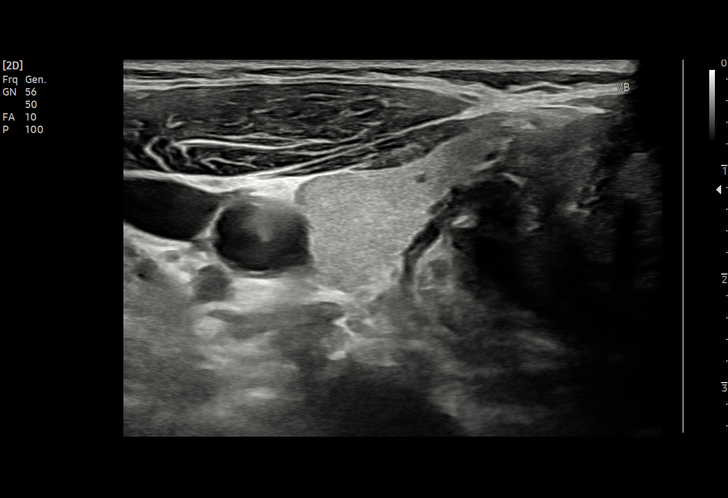
[im 18/25]
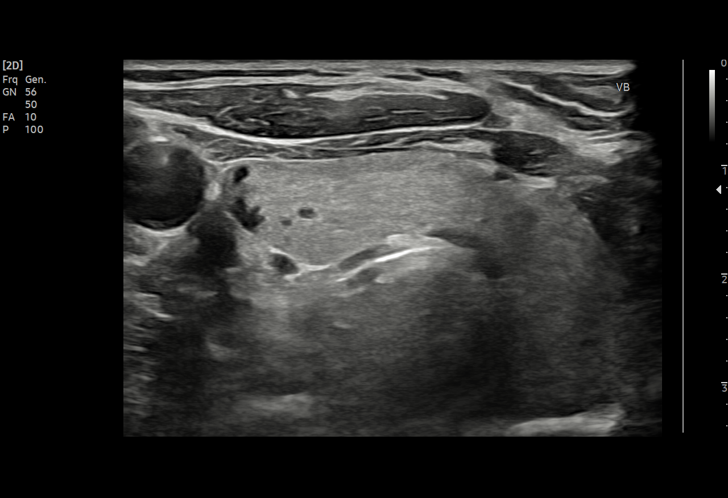
[im 20/25]
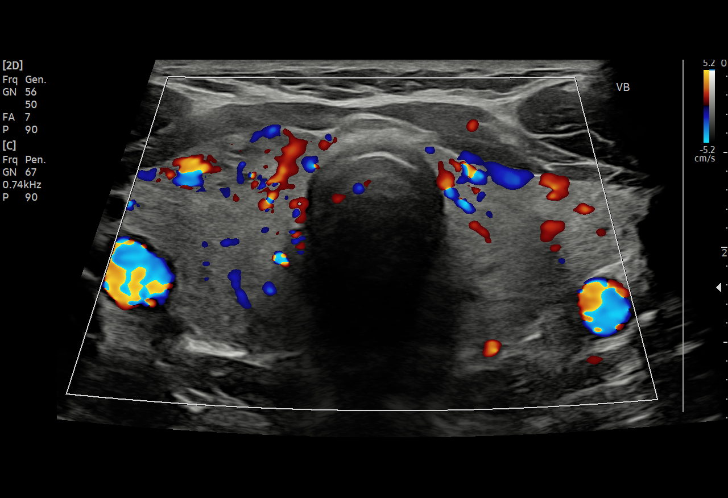
[im 21/25]
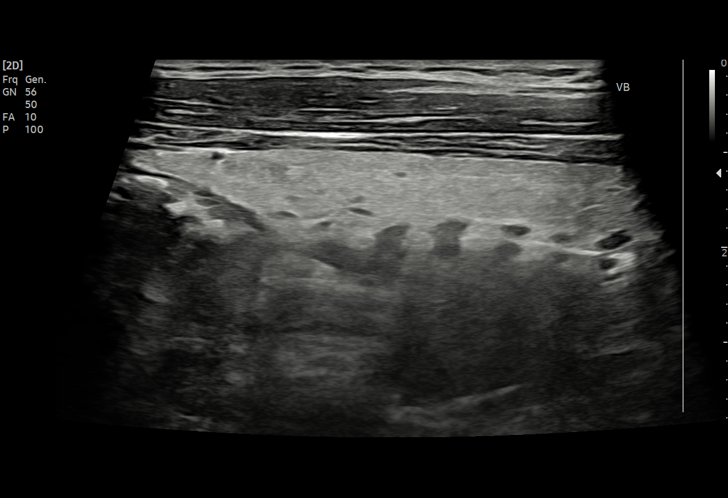
[im 23/25]
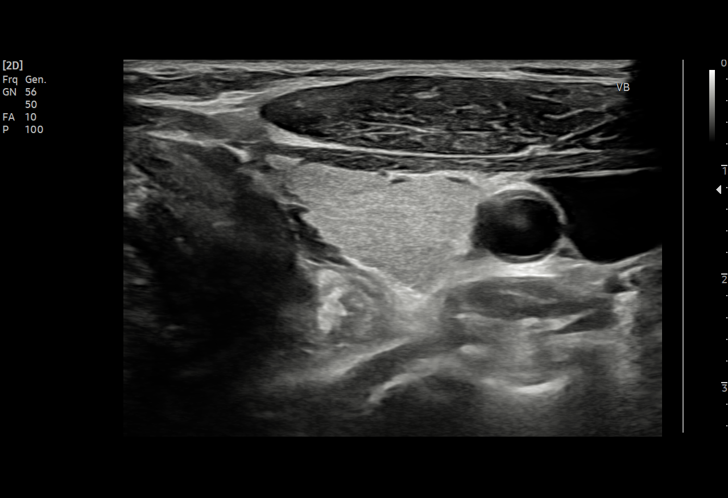
[im 25/25]
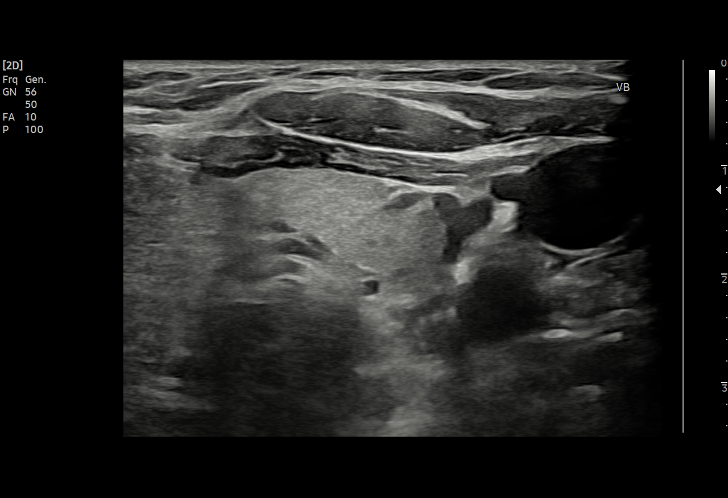

[15 of 25 positions shown; findings below may reference images not displayed]

FINDINGS: Parenchymal Echotexture: Mildly heterogeneous

Isthmus: 0.4 cm

Right lobe: 6.4 x 2.4 x 2.3 cm

Left lobe: 5.4 x 1.7 x 2.2 cm

_________________________________________________________

Estimated total number of nodules >/= 1 cm: 1

Number of spongiform nodules >/=  2 cm not described below (TR1): 0

Number of mixed cystic and solid nodules >/= 1.5 cm not described
below (TR2): 0

_________________________________________________________

Nodule # 1:

Location: Right; mid

Maximum size: 2.6 cm; Other 2 dimensions: 1.4 x 0.9 cm

Composition: solid/almost completely solid (2)

Echogenicity: isoechoic (1)

Shape: not taller-than-wide (0)

Margins: smooth (0)

Echogenic foci: none (0)

ACR TI-RADS total points: 3.

ACR TI-RADS risk category: TR3 (3 points).

ACR TI-RADS recommendations:

**Given size (>/= 2.5 cm) and appearance, fine needle aspiration of
this mildly suspicious nodule should be considered based on TI-RADS
criteria.

_________________________________________________________
IMPRESSION: Nodule 1 (TI-RADS 3) located in the mid right thyroid lobe meets
criteria for FNA.

The above is in keeping with the ACR TI-RADS recommendations - [HOSPITAL] 4137;[DATE].

## 2022-02-01 ENCOUNTER — Encounter: Payer: Self-pay | Admitting: Family Medicine

## 2022-02-05 ENCOUNTER — Ambulatory Visit: Payer: BC Managed Care – PPO | Admitting: Family Medicine

## 2022-02-05 NOTE — Progress Notes (Deleted)
    SUBJECTIVE:   CHIEF COMPLAINT / HPI:   Hypertension: Patient is a 51 y.o. female who presents today for HTN follow-up.  Home medications include: losartan-HCTZ 50-12.5 and amlodipine 10mg  Patient reports *** compliance.  Patient {[id number]} check blood pressure at home.  Patient {HAS HAS had a BMP in the past 1 year.   Health Maintenance -due for mammogram, discussed today: assist in scheduling? -consider lipid panel (last done in 2019, normal at that time)  PERTINENT  PMH / PSH: ***  OBJECTIVE:   There were no vitals taken for this visit.  ***  ASSESSMENT/PLAN:   No problem-specific Assessment & Plan notes found for this encounter.   A Swahili interpreter was used for the duration of this encounter.    2020, MD [facility]

## 2022-02-20 IMAGING — US US FNA BIOPSY THYROID 1ST LESION
1 series · 10 of 10 positions shown · non-contrast
Comparison: US 05/15/21

MEDICATIONS:
5 cc 1% lidocaine

COMPLICATIONS:
None immediate.

INDICATION: Right mid lobe thyroid nodule

2.6 cm
EXAM:
ULTRASOUND GUIDED FINE NEEDLE ASPIRATION OF INDETERMINATE THYROID
NODULE
TECHNIQUE: Informed written consent was obtained from the patient after a
discussion of the risks, benefits and alternatives to treatment.
Questions regarding the procedure were encouraged and answered. A
timeout was performed prior to the initiation of the procedure.

[Series 1: us fna biopsy thyroid 1st lesion · 0.05mm/px · 10 acquisitions, 10 frames shown]
[im 1/10]
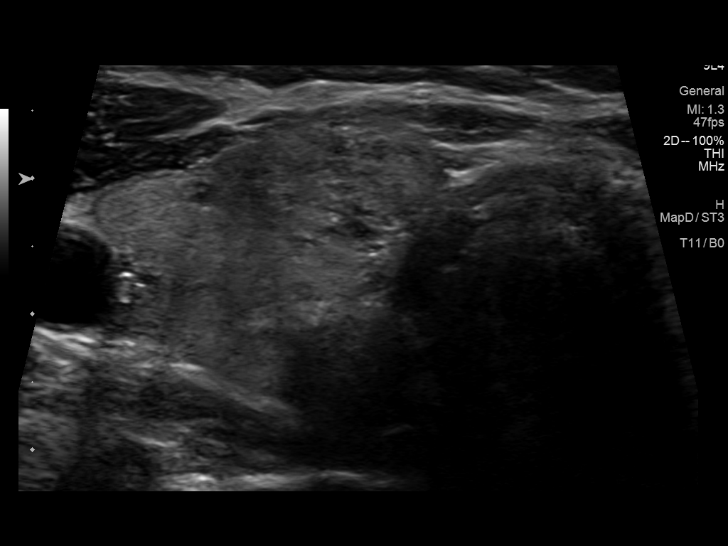
[im 2/10]
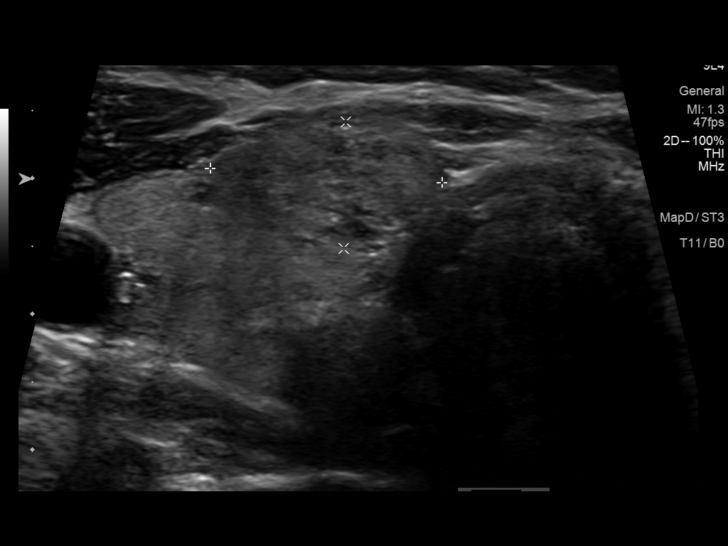
[im 3/10]
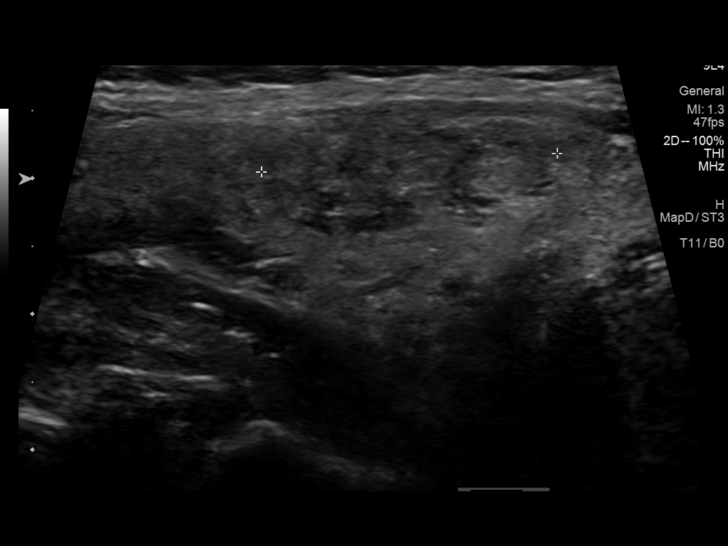
[im 4/10]
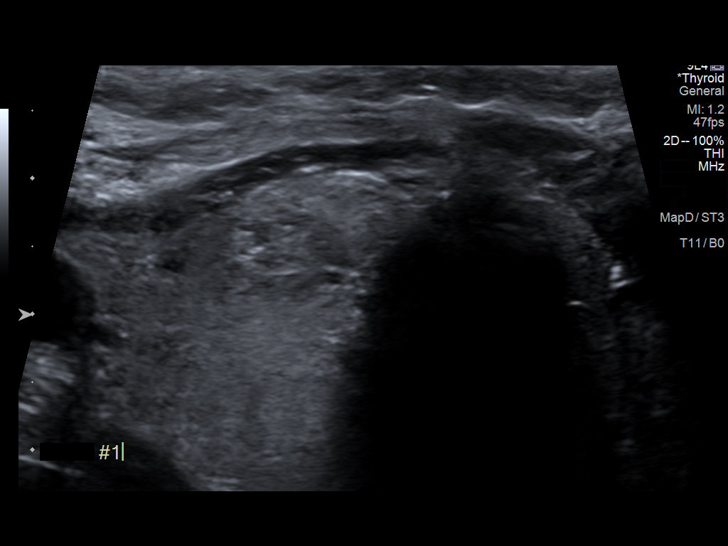
[im 5/10]
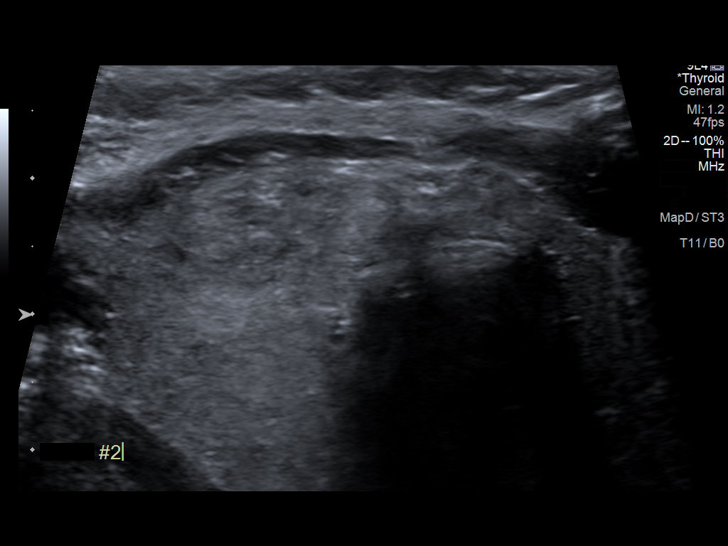
[im 6/10]
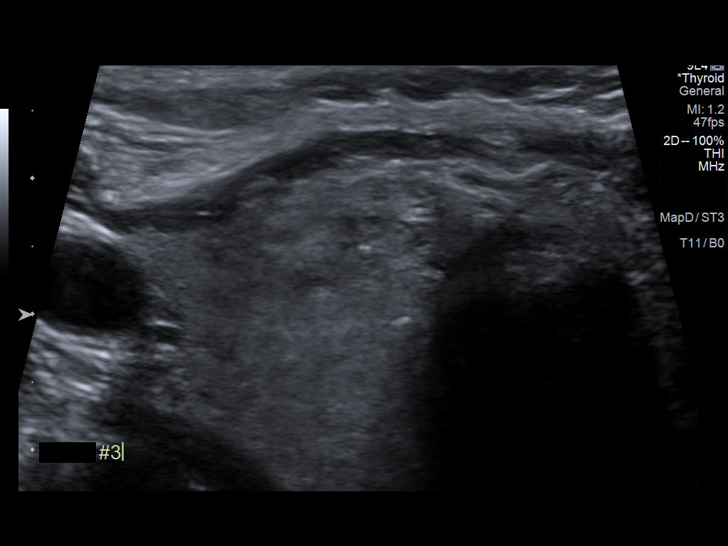
[im 7/10]
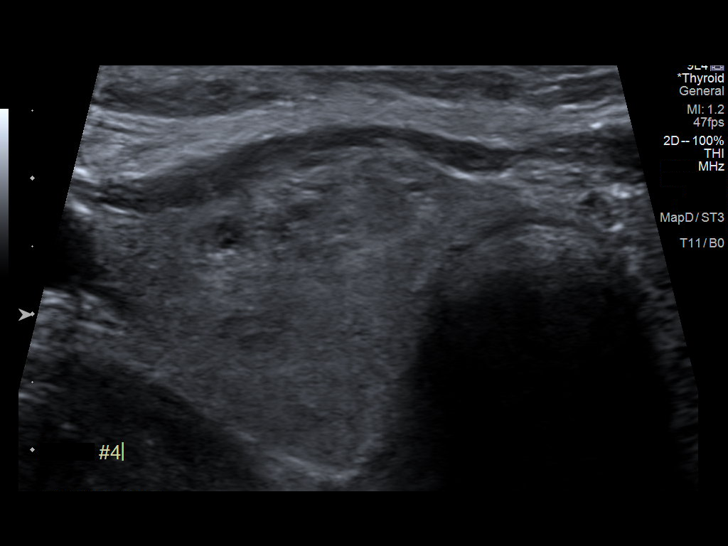
[im 8/10]
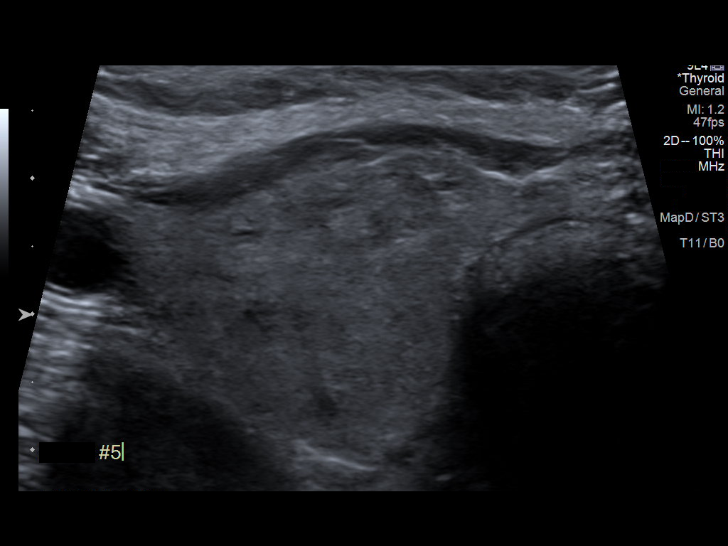
[im 9/10]
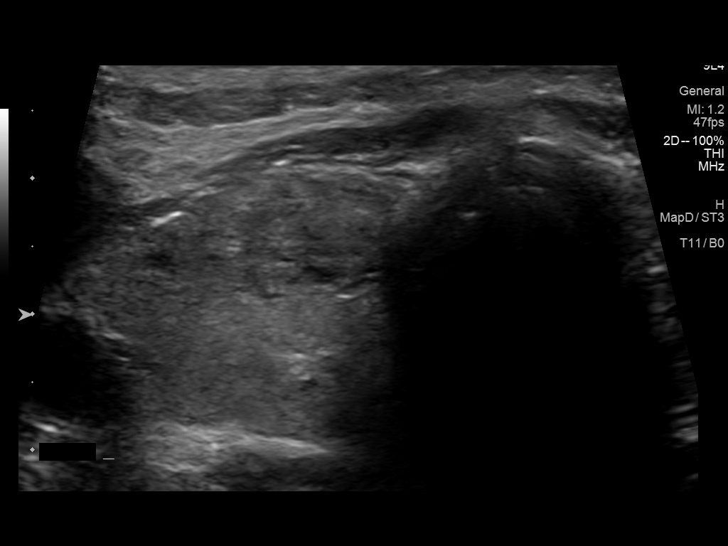
[im 10/10]
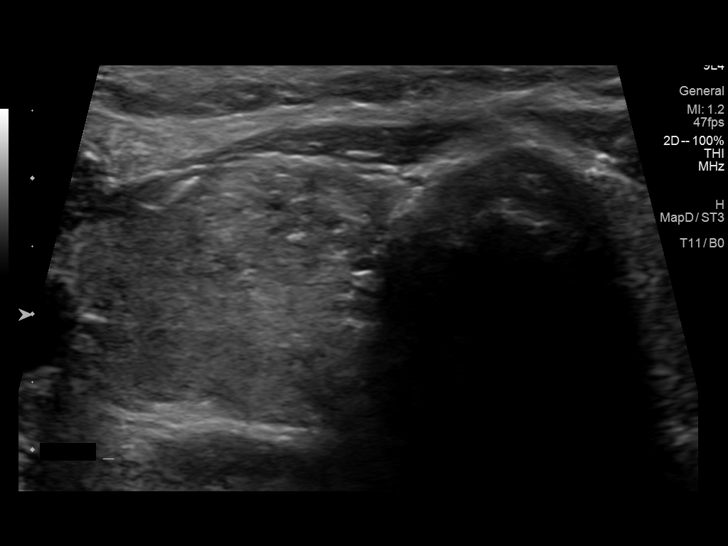

[10 of 10 positions shown; findings below may reference images not displayed]

Pre-procedural ultrasound scanning demonstrated unchanged size and
appearance of the indeterminate nodule within the right thyroid

The procedure was planned. The neck was prepped in the usual sterile
fashion, and a sterile drape was applied covering the operative
field. A timeout was performed prior to the initiation of the
procedure. Local anesthesia was provided with 1% lidocaine.

Under direct ultrasound guidance, 5 FNA biopsies were performed of
the right mid lobe thyroid nodule with a 27 gauge needle.

2 of these samples were obtained for AFIRMA.

Multiple ultrasound images were saved for procedural documentation
purposes. The samples were prepared and submitted to pathology.

Limited post procedural scanning was negative for hematoma or
additional complication. Dressings were placed. The patient
tolerated the above procedures procedure well without immediate
postprocedural complication.
FINDINGS: Nodule reference number based on prior diagnostic ultrasound: 1

Maximum size:

Location: Right; Mid

ACR TI-RADS risk category: TR3 (3 points)

Reason for biopsy: meets ACR TI-RADS criteria

Ultrasound imaging confirms appropriate placement of the needles
within the thyroid nodule.
IMPRESSION: Technically successful ultrasound guided fine needle aspiration of
right mid lobe thyroid nodule

Read by

Tova Balla

## 2022-03-17 ENCOUNTER — Encounter: Payer: Self-pay | Admitting: Family Medicine

## 2022-03-17 ENCOUNTER — Ambulatory Visit (INDEPENDENT_AMBULATORY_CARE_PROVIDER_SITE_OTHER): Payer: BC Managed Care – PPO | Admitting: Family Medicine

## 2022-03-17 VITALS — BP 150/100 | HR 66 | Ht 68.0 in | Wt 204.4 lb

## 2022-03-17 DIAGNOSIS — I10 Essential (primary) hypertension: Secondary | ICD-10-CM | POA: Diagnosis not present

## 2022-03-17 MED ORDER — BLOOD PRESSURE CUFF MISC: 1.0000 [IU] | Freq: Every day | 0 refills | Status: AC

## 2022-03-17 MED ORDER — AMLODIPINE BESYLATE 10 MG PO TABS: 10.0000 mg | ORAL_TABLET | Freq: Every day | ORAL | 1 refills | Status: DC

## 2022-03-17 MED ORDER — LOSARTAN POTASSIUM-HCTZ 50-12.5 MG PO TABS: 1.0000 | ORAL_TABLET | Freq: Every day | ORAL | 1 refills | Status: DC

## 2022-03-17 NOTE — Assessment & Plan Note (Addendum)
BP above goal today due to running out of medication. Typically has excellent compliance. -Continue current medications without change (amlodipine 10mg  daily, losartan-HCTZ 50-12.5mg  daily). Refills sent. -Rx sent for new BP cuff. Encouraged her to resume checking BP at home. -BMP wnl 3 months ago. -Follow up with PCP in 3 months. Recommend repeat BMP at that time

## 2022-03-17 NOTE — Progress Notes (Signed)
    SUBJECTIVE:   CHIEF COMPLAINT / HPI:   Hypertension: Patient is a 51 y.o. female who presents today for HTN follow-up.  Home medications include: amlodipine 10mg  daily and losartan-HCTZ 50-12.5mg  daily. Ran out 2 weeks ago. Patient reports excellent medication compliance prior to running out.  Patient does not check blood pressure at home. Her BP cuff broke-- states her PCP is aware.  Patient has had a BMP in the past 1 year.  PERTINENT  PMH / PSH: HTN, prediabetes  OBJECTIVE:   BP (!) 150/100   Pulse 66   Ht 5\' 8"  (1.727 m)   Wt 204 lb 6 oz (92.7 kg)   SpO2 100%   BMI 31.08 kg/m   Gen: NAD, pleasant, able to participate in exam CV: RRR, normal S1/S2, no murmur Resp: Normal effort, lungs CTAB Extremities: no edema or cyanosis Skin: warm and dry, no rashes noted Neuro: alert, no obvious focal deficits Psych: Normal affect and mood  ASSESSMENT/PLAN:   Primary hypertension BP above goal today due to running out of medication. Typically has excellent compliance. -Continue current medications without change (amlodipine 10mg  daily, losartan-HCTZ 50-12.5mg  daily). Refills sent. -Rx sent for new BP cuff. Encouraged her to resume checking BP at home. -BMP wnl 3 months ago. -Follow up with PCP in 3 months. Recommend repeat BMP at that time   An in-person Swahili interpreter was used for the duration of this encounter.    , MD [facility]

## 2022-03-17 NOTE — Patient Instructions (Addendum)
It was great to see you!  I have sent refills on your blood pressure medicines.  I have also sent a prescription for a new blood pressure machine.  Please follow up with your regular doctor in 3 months.  Take care and seek immediate care sooner if you develop any concerns.  Dr. [facility]

## 2022-04-10 IMAGING — US US FNA BIOPSY THYROID 1ST LESION
1 series · 13 of 13 positions shown · non-contrast
Comparison: 06/18/2021

MEDICATIONS:
1% lidocaine

COMPLICATIONS:
None immediate.

INDICATION: Indeterminate thyroid nodule

EXAM:
ULTRASOUND GUIDED FINE NEEDLE ASPIRATION OF INDETERMINATE THYROID
NODULE
TECHNIQUE: Informed written consent was obtained from the patient after a
discussion of the risks, benefits and alternatives to treatment.
Questions regarding the procedure were encouraged and answered. A
timeout was performed prior to the initiation of the procedure.

[Series 1: us fna biopsy thyroid 1st lesion · 0.05mm/px · 13 acquisitions, 13 frames shown]
[im 1/13]
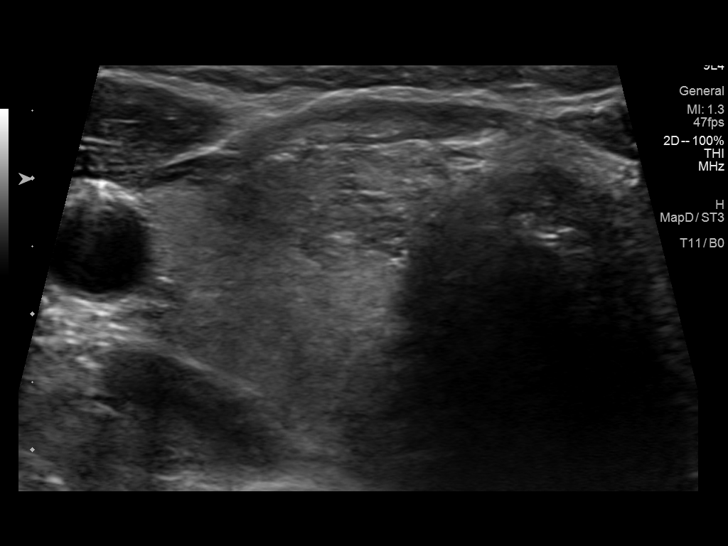
[im 2/13]
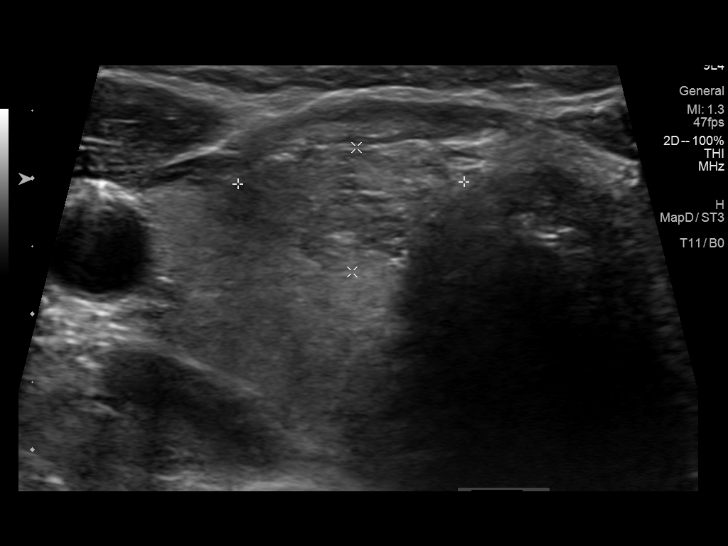
[im 3/13]
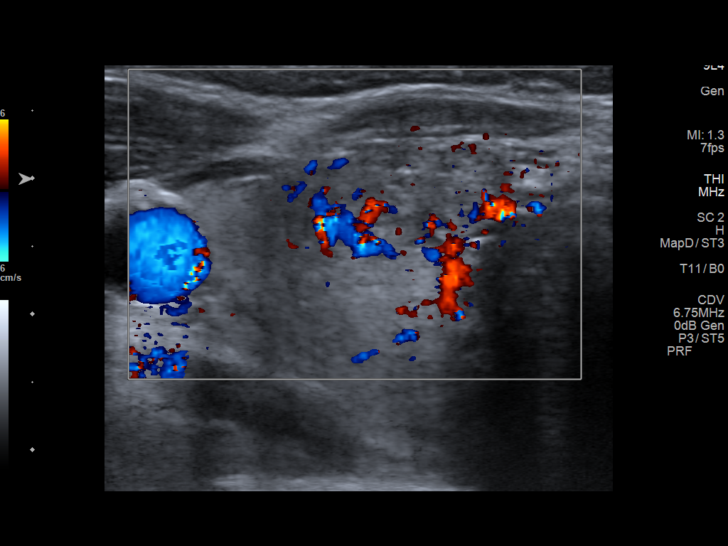
[im 4/13]
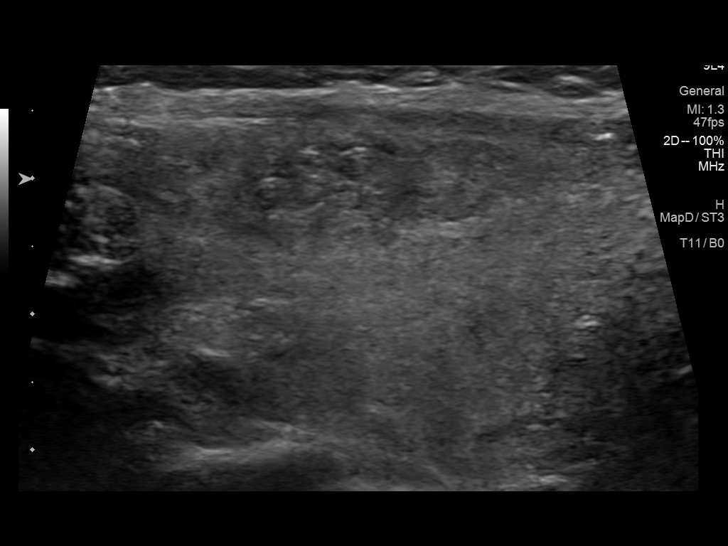
[im 5/13]
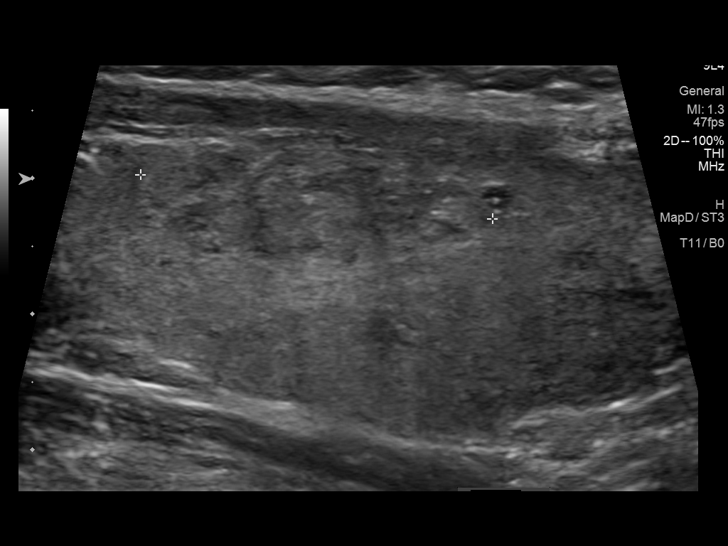
[im 6/13]
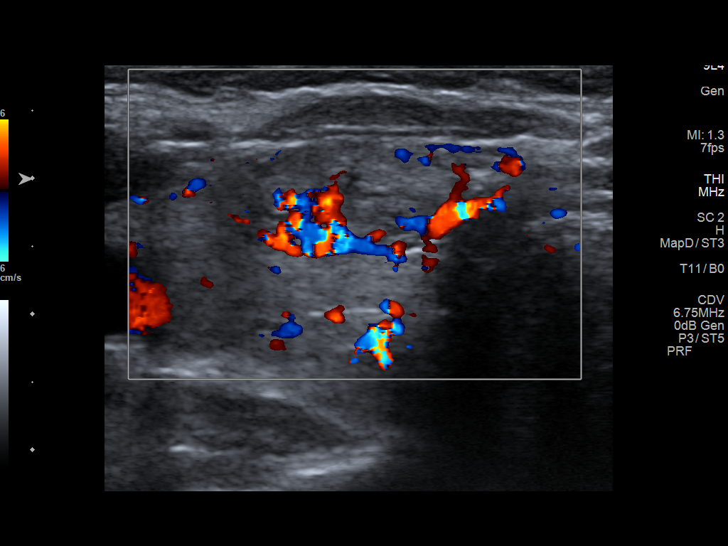
[im 7/13]
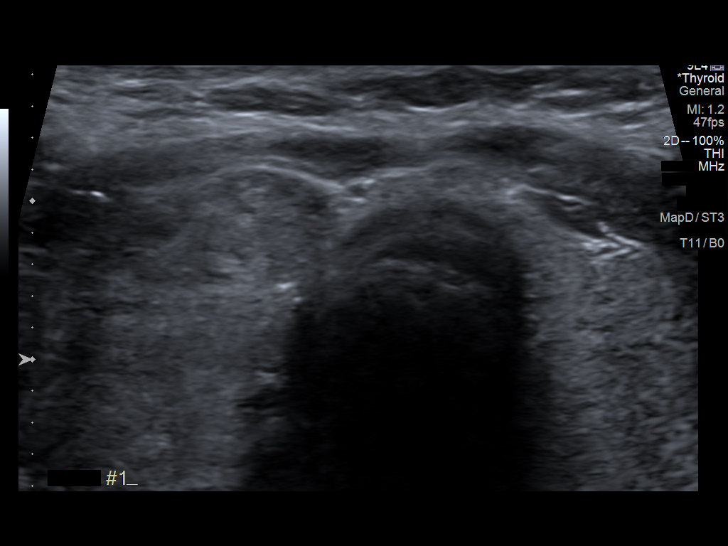
[im 8/13]
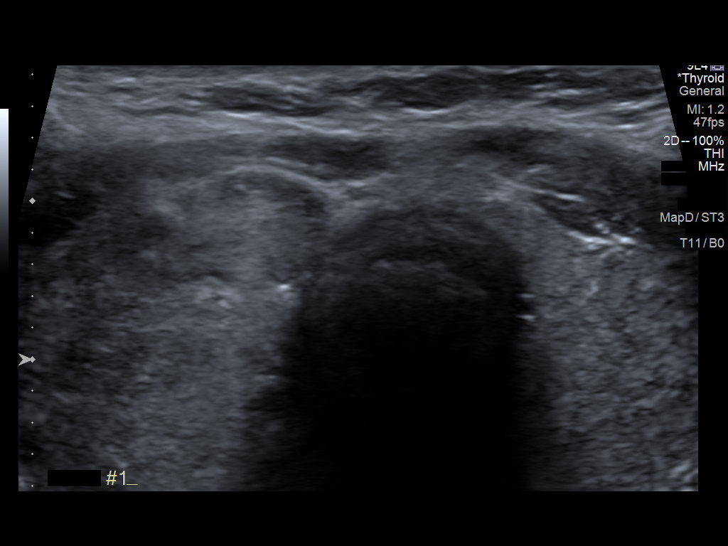
[im 9/13]
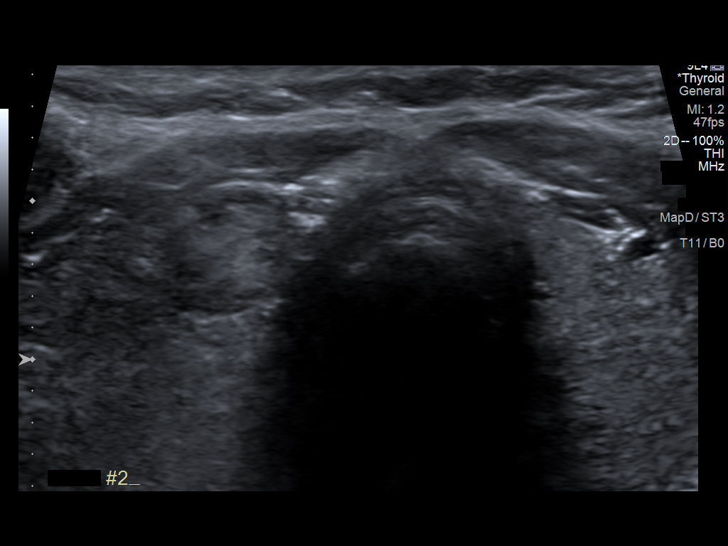
[im 10/13]
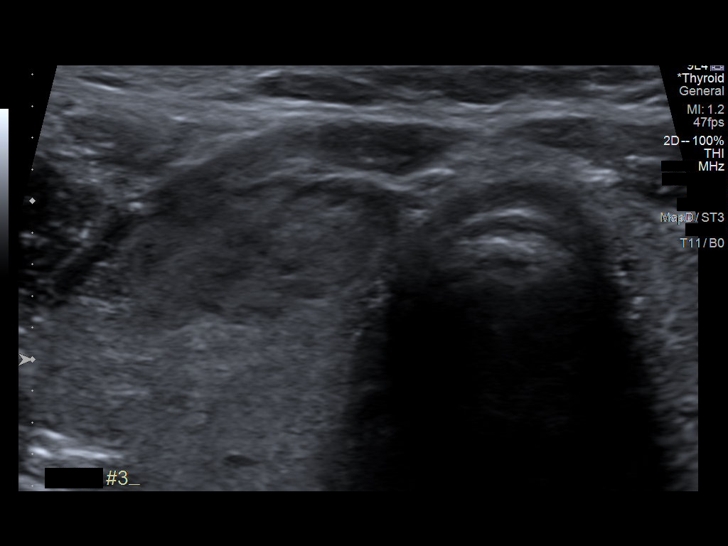
[im 11/13]
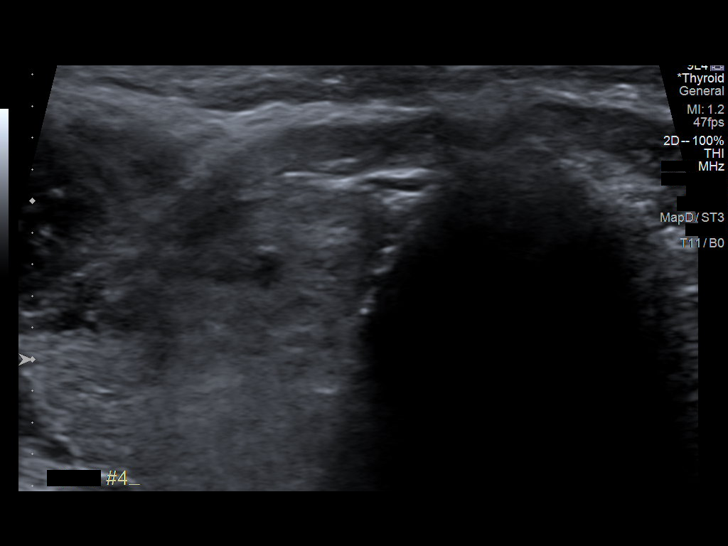
[im 12/13]
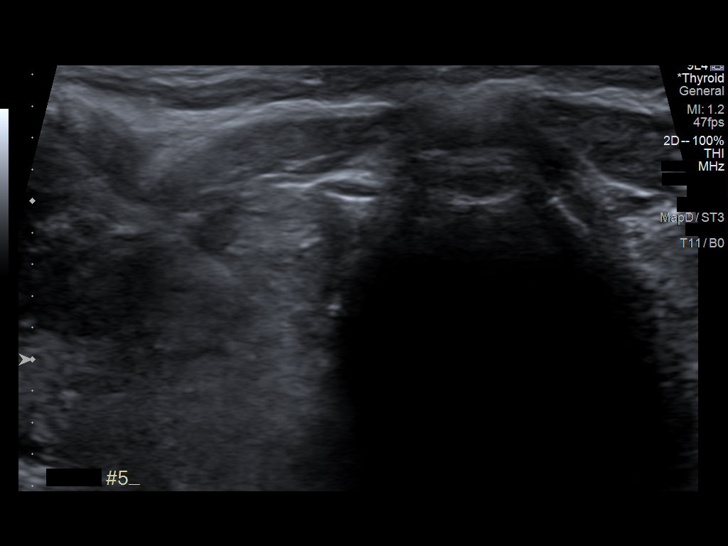
[im 13/13]
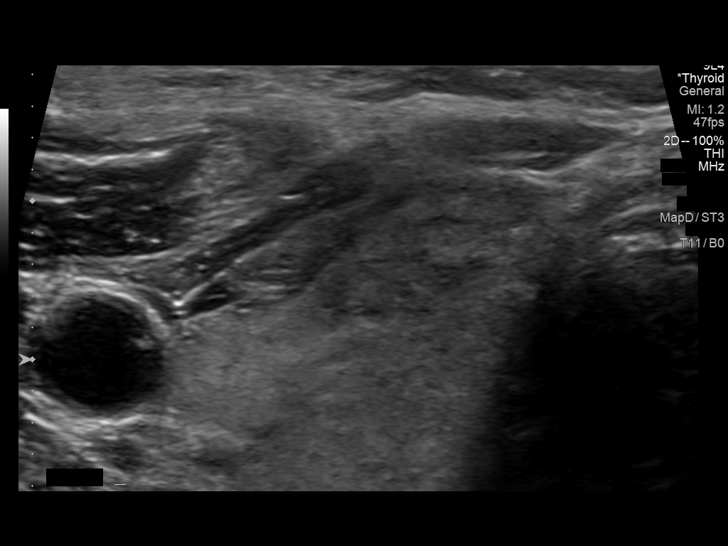

[13 of 13 positions shown; findings below may reference images not displayed]

Pre-procedural ultrasound scanning demonstrated unchanged size and
appearance of the indeterminate nodule within the 2.6 cm right mid
thyroid TR 3 nodule medially

The procedure was planned. The neck was prepped in the usual sterile
fashion, and a sterile drape was applied covering the operative
field. A timeout was performed prior to the initiation of the
procedure. Local anesthesia was provided with 1% lidocaine.

Under direct ultrasound guidance, 5 FNA biopsies were performed of
the 2.6 cm right mid thyroid TR 3 nodule with a 25 gauge needle.
Multiple ultrasound images were saved for procedural documentation
purposes. The samples were prepared and submitted to pathology.

Limited post procedural scanning was negative for hematoma or
additional complication. Dressings were placed. The patient
tolerated the above procedures procedure well without immediate
postprocedural complication.
FINDINGS: Nodule reference number based on prior diagnostic ultrasound: 1

Maximum size: 2.6 cm

Location: Right; Mid

ACR TI-RADS risk category: TR3 (3 points)

Reason for biopsy: meets ACR TI-RADS criteria

Ultrasound imaging confirms appropriate placement of the needles
within the thyroid nodule.
IMPRESSION: Technically successful ultrasound guided fine needle aspiration of
the 2.6 cm right mid thyroid TR 3 nodule (nodule 1)

## 2022-05-05 ENCOUNTER — Encounter: Payer: Self-pay | Admitting: Student

## 2022-05-05 ENCOUNTER — Ambulatory Visit (INDEPENDENT_AMBULATORY_CARE_PROVIDER_SITE_OTHER): Payer: BC Managed Care – PPO | Admitting: Student

## 2022-05-05 VITALS — BP 140/92 | HR 64 | Wt 205.0 lb

## 2022-05-05 DIAGNOSIS — I10 Essential (primary) hypertension: Secondary | ICD-10-CM | POA: Diagnosis not present

## 2022-05-05 DIAGNOSIS — R7303 Prediabetes: Secondary | ICD-10-CM

## 2022-05-05 MED ORDER — LOSARTAN POTASSIUM-HCTZ 50-12.5 MG PO TABS: 1.0000 | ORAL_TABLET | Freq: Every day | ORAL | 1 refills | Status: DC

## 2022-05-05 MED ORDER — AMLODIPINE BESYLATE 10 MG PO TABS: 10.0000 mg | ORAL_TABLET | Freq: Every day | ORAL | 1 refills | Status: DC

## 2022-05-05 NOTE — Patient Instructions (Addendum)
Ms. Maggi,  I want you to pick up your blood pressure medicines ASAP from your pharmacy. I will see you back on Friday to discuss your sleep. In the meantime, I want you to do the exercises we talked about for your foot. A frozen water bottle is a great option to stretch it out. You may also take ibuprofen.   [place] uchukue dawa zako za shinikizo la damu HARAKA kutoka kwa duka lako la dawa. Nitakuona tena [place] ili kujadili usingizi wako. [place] huo huo, nataka ufanye mazoezi ambayo tulizungumza juu ya [employer]. [place] ya maji waliohifadhiwa ni chaguo nzuri [place]. Unaweza pia kuchukua ibuprofen.  Pearla Dubonnet, MD

## 2022-05-05 NOTE — Assessment & Plan Note (Signed)
Patient requests new Rx be sent to pharmacy. She will pick up today and start taking meds. Unfortunately she is traveling out of the country and cannot come in for a BP recheck until December. I am seeing her in two days for another acute care issue and will ensure she has picked up/started her prescriptions. - F/u in 5 weeks - BMP at follow-up

## 2022-05-05 NOTE — Assessment & Plan Note (Addendum)
Per patient request, will obtain A1c with next lab draw. Again stressed the value of lifestyle interventions in chronic disease management.

## 2022-05-05 NOTE — Progress Notes (Signed)
    SUBJECTIVE:   CHIEF COMPLAINT / HPI:   Hypertension Seen by Dr. Rock Nephew ~1 month ago for a BP check and BP was elevated at that visit in the setting of having not taken her meds in some time. Unfortunately, she has still not been taking her medications and remains with elevated BP today. She denies any issues at the pharmacy, just has not picked them up. Denies headache, chest pain, dizziness, SOB.   She is traveling to [place] for the next four weeks or so.   Prediabetes Noted ~2 years ago. Has not made any lifestyle interventions in light of diagnosis. Discussed possibly rechecking A1c today, but patient declines at this time. Would like to consolidate blood collections to next visit.    OBJECTIVE:   BP (!) 140/92   Pulse 64   Wt 205 lb (93 kg)   SpO2 98%   BMI 31.17 kg/m  Physical Exam Vitals reviewed.  Constitutional:      General: She is not in acute distress. HENT:     Mouth/Throat:     Mouth: Mucous membranes are moist.  Cardiovascular:     Rate and Rhythm: Normal rate and regular rhythm.     Heart sounds: No murmur heard.    No friction rub. No gallop.  Skin:    General: Skin is warm and dry.  Neurological:     General: No focal deficit present.      ASSESSMENT/PLAN:   Primary hypertension Patient requests new Rx be sent to pharmacy. She will pick up today and start taking meds. Unfortunately she is traveling out of the country and cannot come in for a BP recheck until December. I am seeing her in two days for another acute care issue and will ensure she has picked up/started her prescriptions. - F/u in 5 weeks - BMP at follow-up   Prediabetes Per patient request, will obtain A1c with next lab draw. Again stressed the value of lifestyle interventions in chronic disease management.      Pearla Dubonnet, MD [place]

## 2022-05-07 ENCOUNTER — Ambulatory Visit (INDEPENDENT_AMBULATORY_CARE_PROVIDER_SITE_OTHER): Payer: BC Managed Care – PPO | Admitting: Student

## 2022-05-07 ENCOUNTER — Encounter: Payer: Self-pay | Admitting: Student

## 2022-05-07 VITALS — BP 138/84 | HR 58 | Wt 207.0 lb

## 2022-05-07 DIAGNOSIS — K5900 Constipation, unspecified: Secondary | ICD-10-CM | POA: Diagnosis not present

## 2022-05-07 DIAGNOSIS — G4700 Insomnia, unspecified: Secondary | ICD-10-CM | POA: Diagnosis not present

## 2022-05-07 MED ORDER — POLYETHYLENE GLYCOL 3350 17 GM/SCOOP PO POWD
17.0000 g | Freq: Two times a day (BID) | ORAL | 5 refills | Status: DC
Start: 2022-05-07 — End: 2024-02-08

## 2022-05-07 MED ORDER — DOXEPIN HCL 10 MG/ML PO CONC: 6.0000 mg | Freq: Every day | ORAL | 0 refills | Status: DC

## 2022-05-07 NOTE — Patient Instructions (Addendum)
Ms. Menn,  [place] dawa ya Nile Dear. Inaitwa Doxepin, nataka uchukue 6mg  kila usiku karibu saa 1 kabla ya kulala. Unaweza pia kupata manufaa Georgina Quint ratiba Renae Gloss siku zako za [place] na siku zisizo Luetta Nutting.  Pia ninatuma kujaza kwenye MiraLAX yako. [place] ujaribu kuchukua hii mara mbili kwa siku kwa [place]. Fausto Skillern kupata choo laini zaidi ya mara mbili kila siku, unaweza kupunguza hadi kifuniko kimoja kwa siku.  Nitakuona baada Armed forces operational officer.   I am sending in a medicine to help with sleep. It is called Doxepin, I want you to take 6mg  every night about 1 hour before sleep.  You may also find it helpful to keep the same schedule on your work days and non-work days.  I am also sending in a refill on your MiraLAX. I want you to try taking this twice daily for constipation. If you start having more than two soft bowel movements each day, you can cut back to one capful per day.   I will see you in a few weeks.   Pearla Dubonnet, MD

## 2022-05-07 NOTE — Assessment & Plan Note (Signed)
Failed melatonin. Reasonably good sleep hygiene. Will add doxepin 6mg  nightly and also discussed keeping sleep schedule the same on work and non-work days. May also benefit from adding in some exercise, but not in the hours just before sleep.

## 2022-05-07 NOTE — Assessment & Plan Note (Signed)
Has not been using MiraLax given inefficacy of 1 scoop daily. May ultimately need a prokinetic agent, but will start by pushing MiraLax. - MiraLax 1 scoop twice daily - Titration instructions reviewed - Will revisit at BP follow-up in a few weeks

## 2022-05-07 NOTE — Progress Notes (Signed)
    SUBJECTIVE:   CHIEF COMPLAINT / HPI:   Insomnia Ongoing issue over many years. Patient reports generally good sleep hygiene, no TV or caffeine. 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 Has tried melatonin in the past without success. Works at the [employer] and usually gets off work at [employer]. Does not do any form of organized exercise.   Constipation Reports irregular BMs and a sense of fullness that makes her feel like her appetite is suppressed, though no recent weight change. Has been an issue in the past previously treated with MiraLax with no response, though never titrated beyond one scoop per day.  No blood in stools. UTD on colonoscopy.    OBJECTIVE:   BP 138/84   Pulse (!) 58   Wt 207 lb (93.9 kg)   SpO2 98%   BMI 31.47 kg/m   Physical Exam Vitals reviewed.  Constitutional:      General: She is not in acute distress. Cardiovascular:     Rate and Rhythm: Normal rate and regular rhythm.     Heart sounds: No murmur heard.    No friction rub. No gallop.  Pulmonary:     Effort: Pulmonary effort is normal. No respiratory distress.     Breath sounds: Normal breath sounds.  Abdominal:     General: Abdomen is flat. There is no distension.     Palpations: Abdomen is soft. There is no mass.     Tenderness: There is no abdominal tenderness.  Neurological:     Mental Status: She is alert.      ASSESSMENT/PLAN:   Insomnia Failed melatonin. Reasonably good sleep hygiene. Will add doxepin 6mg  nightly and also discussed keeping sleep schedule the same on work and non-work days. May also benefit from adding in some exercise, but not in the hours just before sleep.   Constipation Has not been using MiraLax given inefficacy of 1 scoop daily. May ultimately need a prokinetic agent, but will start by pushing MiraLax. - MiraLax 1 scoop twice daily - Titration instructions reviewed - Will revisit at  BP follow-up in a few weeks     Pearla Dubonnet, MD [place]

## 2022-06-07 ENCOUNTER — Encounter: Payer: Self-pay | Admitting: Student

## 2022-06-07 ENCOUNTER — Ambulatory Visit (INDEPENDENT_AMBULATORY_CARE_PROVIDER_SITE_OTHER): Payer: BC Managed Care – PPO | Admitting: Student

## 2022-06-07 VITALS — BP 122/80 | HR 75 | Wt 205.0 lb

## 2022-06-07 DIAGNOSIS — G4700 Insomnia, unspecified: Secondary | ICD-10-CM

## 2022-06-07 DIAGNOSIS — I10 Essential (primary) hypertension: Secondary | ICD-10-CM

## 2022-06-07 DIAGNOSIS — R7303 Prediabetes: Secondary | ICD-10-CM | POA: Diagnosis not present

## 2022-06-07 MED ORDER — AMITRIPTYLINE HCL 50 MG PO TABS: 50.0000 mg | ORAL_TABLET | Freq: Every day | ORAL | 1 refills | Status: DC

## 2022-06-07 NOTE — Assessment & Plan Note (Signed)
Doxepin ineffective. Will trial Elavil 50mg  nightly.  - If Elavil ineffective, could consider Suvorexant at follow-up

## 2022-06-07 NOTE — Progress Notes (Signed)
    SUBJECTIVE:   CHIEF COMPLAINT / HPI:   Hypertension Patient seen several weeks ago with uncontrolled BP in the setting of having not taken her BP meds. Prescribed amlodipine 10mg  daily and losartan-hydrochlorothiazide 50-12.5mg  daily. She has since started taking these meds and has been taking them daily without issue. No new swelling or changes that she has noticed since starting.   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 Has issues with getting to sleep, even after a physically demanding third shift. Tries to keep a similar sleep schedule on her on-vs-off days and generally has good sleep hygiene--no screens or caffeine. Has tried melatonin without success. Started low-dose doxepin at our last visit and this has likewise been ineffective.     OBJECTIVE:   BP 122/80   Pulse 75   Wt 205 lb (93 kg)   SpO2 98%   BMI 31.17 kg/m   Physical Exam Vitals reviewed.  Constitutional:      General: She is not in acute distress. Cardiovascular:     Rate and Rhythm: Normal rate and regular rhythm.     Heart sounds: No murmur heard. Pulmonary:     Effort: Pulmonary effort is normal. No respiratory distress.     Breath sounds: Normal breath sounds.  Musculoskeletal:        General: No swelling.  Neurological:     General: No focal deficit present.      ASSESSMENT/PLAN:   Primary hypertension Excellent BP control today.  - Continue Amlodipine 10mg  daily and Losartan-HCTZ 50-12.5mg  daily - BMP today  Insomnia Doxepin ineffective. Will trial Elavil 50mg  nightly.  - If Elavil ineffective, could consider Suvorexant at follow-up      , MD [facility]

## 2022-06-07 NOTE — Assessment & Plan Note (Signed)
Excellent BP control today.  - Continue Amlodipine 10mg  daily and Losartan-HCTZ 50-12.5mg  daily - BMP today

## 2022-06-07 NOTE — Patient Instructions (Addendum)
Monica Li,  Shinikizo lako la damu ni bora leo! [place] uendelee kutumia dawa zako za shinikizo la damu (amlodipine na losartan-HCTZ) kila siku. Ninabadilisha dawa yako ya usingizi kutoka doxepin, suluhisho, hadi amitriptyline ambayo ni kidonge. Unapaswa kuchukua hii usiku kabla ya [place]. Monica Li ratiba sawa ya kulala siku ambazo unafanya [place] na siku ambazo haufanyi [place].  Your blood pressure is excellent today! I want you to keep taking your blood pressure medications (amlodipine and losartan-HCTZ) every day. I am changing your sleep medicine from doxepin, the solution, to amitriptyline which is a pill. You should take this nightly before bed. Try to keep the same sleep schedule on days that you are working and days you are not.   Monica Gibbs, MD

## 2022-06-08 LAB — HEMOGLOBIN A1C
Est. average glucose Bld gHb Est-mCnc: 131 mg/dL
Hgb A1c MFr Bld: 6.2 % — ABNORMAL HIGH (ref 4.8–5.6)

## 2022-06-08 LAB — BASIC METABOLIC PANEL
BUN/Creatinine Ratio: 13 (ref 9–23)
BUN: 10 mg/dL (ref 6–24)
CO2: 22 mmol/L (ref 20–29)
Calcium: 9.6 mg/dL (ref 8.7–10.2)
Chloride: 101 mmol/L (ref 96–106)
Creatinine, Ser: 0.8 mg/dL (ref 0.57–1.00)
Glucose: 82 mg/dL (ref 70–99)
Potassium: 3.4 mmol/L — ABNORMAL LOW (ref 3.5–5.2)
Sodium: 141 mmol/L (ref 134–144)
eGFR: 88 mL/min/{1.73_m2} (ref >59)

## 2022-07-21 ENCOUNTER — Encounter: Payer: Self-pay | Admitting: Student

## 2022-07-21 ENCOUNTER — Ambulatory Visit (INDEPENDENT_AMBULATORY_CARE_PROVIDER_SITE_OTHER): Payer: Medicaid Other | Admitting: Student

## 2022-07-21 VITALS — BP 156/105 | HR 66 | Wt 205.0 lb

## 2022-07-21 DIAGNOSIS — Z1231 Encounter for screening mammogram for malignant neoplasm of breast: Secondary | ICD-10-CM | POA: Diagnosis not present

## 2022-07-21 DIAGNOSIS — R0781 Pleurodynia: Secondary | ICD-10-CM

## 2022-07-21 DIAGNOSIS — Z0000 Encounter for general adult medical examination without abnormal findings: Secondary | ICD-10-CM | POA: Diagnosis not present

## 2022-07-21 DIAGNOSIS — I10 Essential (primary) hypertension: Secondary | ICD-10-CM

## 2022-07-21 DIAGNOSIS — G4700 Insomnia, unspecified: Secondary | ICD-10-CM

## 2022-07-21 MED ORDER — LOSARTAN POTASSIUM-HCTZ 50-12.5 MG PO TABS: 1.0000 | ORAL_TABLET | Freq: Every day | ORAL | 3 refills | Status: DC

## 2022-07-21 MED ORDER — AMLODIPINE BESYLATE 10 MG PO TABS: 10.0000 mg | ORAL_TABLET | Freq: Every day | ORAL | 3 refills | Status: DC

## 2022-07-21 NOTE — Patient Instructions (Addendum)
Ms. Nazir,  I'm so glad that the Elavil is helping your sleep. Keep taking this.   I think that the pain in your side is from the muscles, you can use Tylenol or Ibuprofen for this. The Vicks is also not hurting anything, so you may continue this if it is helping.  [facility] call the [facility] to [facility] your mamm[facility], their number is [phone]. Their address is [address], [place], [place]  [address]  I am sending in a full year of your BP meds.  Come back to see me in six months or so,  Pearla Dubonnet, MD

## 2022-07-21 NOTE — Progress Notes (Signed)
    SUBJECTIVE:   CHIEF COMPLAINT / HPI:   HTN Out of BP meds not taking for ~1 week. Does not check BP at home. Reports no side effects with her regimen of amlodipine 10mg  daily and losartan-hctz 50-12.5mg  daily.   Insomnia Nightly Elavil seems to be helping.  This is a relief.   R sided Rib pain  Present x3 days. Mid-axillary on R side. Works a physically demanding job at the [employer] which has exacerbated her symptoms. Worst with palpation of the area, non-exertional unless she bends a certain way. Has not taken anything for it but has been using topical Vick's vapor rub which seems to help. No squeezing/substernal pain or SOB. Non-pleuritic.    OBJECTIVE:   BP (!) 156/105   Pulse 66   Wt 205 lb (93 kg)   SpO2 99%   BMI 31.17 kg/m   Physical Exam Vitals reviewed.  Constitutional:      General: She is not in acute distress. Cardiovascular:     Rate and Rhythm: Normal rate and regular rhythm.     Heart sounds: No murmur heard. Pulmonary:     Effort: Pulmonary effort is normal.     Breath sounds: Normal breath sounds.  Abdominal:     General: Abdomen is flat.     Palpations: Abdomen is soft.  Musculoskeletal:        General: No swelling or deformity.  Skin:    General: Skin is warm and dry.      ASSESSMENT/PLAN:   Primary hypertension Unfortunately has been running out of meds frequently. Says that when she inquires at the pharmacy for a refill, they tell her she needs an appointment--unsure what's going on here. - 90 day supplies of both Losartan-HCTZ and Amlodipine sent to pharmacy  Insomnia Excellent response to Elavil - Continue current management  Rib pain Suspect MSK pain 2/2 physical labor at [employer]. No evidence of cardiac etiology. Lungs clear, low suspicion for pneumonia.  - Advised to use Tylenol/ibuprofen, fine to continue Vick's if it seems to be helping  Healthcare maintenance Due for [facility]     Pearla Dubonnet,  MD [place]

## 2022-07-22 DIAGNOSIS — R0789 Other chest pain: Secondary | ICD-10-CM | POA: Insufficient documentation

## 2022-07-22 DIAGNOSIS — R0781 Pleurodynia: Secondary | ICD-10-CM | POA: Insufficient documentation

## 2022-07-22 NOTE — Assessment & Plan Note (Signed)
Due for mammogram: ordered     

## 2022-07-22 NOTE — Assessment & Plan Note (Signed)
Excellent response to Elavil - Continue current management

## 2022-07-22 NOTE — Assessment & Plan Note (Signed)
Unfortunately has been running out of meds frequently. Says that when she inquires at the pharmacy for a refill, they tell her she needs an appointment--unsure what's going on here. - 90 day supplies of both Losartan-HCTZ and Amlodipine sent to pharmacy

## 2022-07-22 NOTE — Assessment & Plan Note (Signed)
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 No evidence of cardiac etiology. Lungs clear, low suspicion for pneumonia.  - Advised to use Tylenol/ibuprofen, fine to continue Vick's if it seems to be helping

## 2022-08-12 ENCOUNTER — Other Ambulatory Visit: Payer: Self-pay | Admitting: Surgery

## 2022-08-12 DIAGNOSIS — E041 Nontoxic single thyroid nodule: Secondary | ICD-10-CM

## 2023-01-11 ENCOUNTER — Telehealth: Payer: Self-pay

## 2023-01-11 NOTE — Telephone Encounter (Signed)
Chart review completed for patient. Patient is due for screening mammogram. Contacted interpreter, no answer at number. Elijio Miles, Population Health Specialist.

## 2023-06-26 NOTE — Progress Notes (Unsigned)
    SUBJECTIVE:   CHIEF COMPLAINT / HPI:   Overdue for mammogram. Order expiring.   HTN  On hydrochlorothiazide 50-12.5mg  and amlodipine 10mg  daily.  PERTINENT  PMH / PSH: ***  OBJECTIVE:   There were no vitals taken for this visit.  ***  ASSESSMENT/PLAN:   No problem-specific Assessment & Plan notes found for this encounter.     Eliezer Mccoy, MD [facility]

## 2023-06-27 ENCOUNTER — Ambulatory Visit (INDEPENDENT_AMBULATORY_CARE_PROVIDER_SITE_OTHER): Payer: 59 | Admitting: Student

## 2023-06-27 ENCOUNTER — Other Ambulatory Visit: Payer: Self-pay | Admitting: Student

## 2023-06-27 ENCOUNTER — Encounter: Payer: Self-pay | Admitting: Student

## 2023-06-27 VITALS — BP 146/102 | HR 61 | Wt 211.8 lb

## 2023-06-27 DIAGNOSIS — F4310 Post-traumatic stress disorder, unspecified: Secondary | ICD-10-CM | POA: Diagnosis not present

## 2023-06-27 DIAGNOSIS — I10 Essential (primary) hypertension: Secondary | ICD-10-CM

## 2023-06-27 MED ORDER — AMLODIPINE-OLMESARTAN 10-40 MG PO TABS: 1.0000 | ORAL_TABLET | Freq: Every day | ORAL | 1 refills | Status: DC

## 2023-06-27 NOTE — Assessment & Plan Note (Addendum)
Blood pressure is above goal x 2 today.  With this and concern for mild hypokalemia over her past several labs, I will plan to discontinue her hydrochlorothiazide and transition her instead to amlodipine-olmesartan with a relatively higher ARB dose.  Her hypokalemia is not significant enough to point me in the direction of hyperaldosteronism at this time. - STOP losartan-hydrochlorothiazide - STOP amlodipine 10mg  daily - START amlodipine-olmesartan 10-40mg  daily - If her hypokalemia and hypertension persist despite changes in therapy, could consider a renin aldosterone ratio and empiric trial spironolactone at a future visit - BMP today  - F/u in two weeks
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
## 2023-06-27 NOTE — Patient Instructions (Addendum)
I have ordered your mammogram. This will be done at the [facility] [place], right across the street from our clinic. You will need to call to make this appointment. Their number is [phone].  The address is [address], [place], [place] [address]  I will see you back in 2 weeks for a BP check.   I am changing your BP meds: START Amlodipine-Olmesartan 10-40mg  1 tablet daily  STOP your losartan-hydrochlorothiazide 50-12.5mg   STOP your amlodipine 10mg  daily So you will be going from two pills daily to one pill daily   J Dorothyann Gibbs, MD

## 2023-06-28 LAB — BASIC METABOLIC PANEL
BUN/Creatinine Ratio: 12 (ref 9–23)
BUN: 10 mg/dL (ref 6–24)
CO2: 25 mmol/L (ref 20–29)
Calcium: 9.2 mg/dL (ref 8.7–10.2)
Chloride: 103 mmol/L (ref 96–106)
Creatinine, Ser: 0.85 mg/dL (ref 0.57–1.00)
Glucose: 105 mg/dL — ABNORMAL HIGH (ref 70–99)
Potassium: 3.7 mmol/L (ref 3.5–5.2)
Sodium: 143 mmol/L (ref 134–144)
eGFR: 81 mL/min/{1.73_m2} (ref >59)

## 2023-06-30 ENCOUNTER — Encounter: Payer: Self-pay | Admitting: Student

## 2023-07-12 ENCOUNTER — Ambulatory Visit (INDEPENDENT_AMBULATORY_CARE_PROVIDER_SITE_OTHER): Payer: 59 | Admitting: Student

## 2023-07-12 ENCOUNTER — Encounter: Payer: Self-pay | Admitting: Student

## 2023-07-12 VITALS — BP 128/88 | HR 69 | Ht 68.0 in | Wt 211.2 lb

## 2023-07-12 DIAGNOSIS — I10 Essential (primary) hypertension: Secondary | ICD-10-CM | POA: Diagnosis not present

## 2023-07-12 DIAGNOSIS — F4310 Post-traumatic stress disorder, unspecified: Secondary | ICD-10-CM

## 2023-07-12 MED ORDER — FLUOXETINE HCL 10 MG PO TABS: 10.0000 mg | ORAL_TABLET | Freq: Every day | ORAL | 1 refills | Status: DC

## 2023-07-12 NOTE — Patient Instructions (Addendum)
 Ms. Posada,  Shinikizo la damu yako inaonekana nzuri leo! Nadhani tunakuletea mchanganyiko sahihi wa dawa za shinikizo la damu.  Ningependa kukuanzishia dawa iitwayo fluoxetine  ili kujaribu kukusaidia na mawazo yako yanayokusumbua/mfadhaiko wa baada ya kiwewe. [place] na kibao kimoja tu kwa siku.   Nimeagiza mammogram yako. Hii itafanywa katika [place] ya barabara kutoka kwa kliniki yetu. [place] kupiga simu ili kufanya miadi hii. Nambari yao ni [phone].  Anwani ni [address], [place], [place] [address]  Your blood pressure looks great today! I think we have you on the right mix of blood pressure medicines.  I'd like to start you on a medication called fluoxetine  to try and help with your intrusive thoughts/post-traumatic stress. We will start with just one tablet per day.   I have ordered your mammogram. This will be done at the [facility] [place], right across the street from our clinic. You will need to call to make this appointment. Their number is [phone].  The address is [address], [place], [place] [address]  JINNY Con Gasman, MD
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
## 2023-07-12 NOTE — Assessment & Plan Note (Signed)
 Thankfully, it seems that her sleep disturbances have been reasonably well-controlled on amitriptyline .  Though this is not considered a first-line treatment for sleep disturbance in the setting of PTSD, will plan to continue it as she seems to have quite good symptom control.  Will plan to start an SSRI today to see if this helps with some of the daytime flashbacks, intrusive thoughts that she is having. -Continue Elavil  50 mg at bedtime -Start Prozac  10 mg daily -Has refugee clinic appt on 2/11 for N-648 evaluation

## 2023-07-12 NOTE — Assessment & Plan Note (Signed)
 BP control is much better today. -Continue amlodipine-olmesartan 10-40 mg daily -Repeat BMP today

## 2023-07-13 LAB — BASIC METABOLIC PANEL
BUN/Creatinine Ratio: 13 (ref 9–23)
BUN: 9 mg/dL (ref 6–24)
CO2: 25 mmol/L (ref 20–29)
Calcium: 9.4 mg/dL (ref 8.7–10.2)
Chloride: 106 mmol/L (ref 96–106)
Creatinine, Ser: 0.69 mg/dL (ref 0.57–1.00)
Glucose: 96 mg/dL (ref 70–99)
Potassium: 3.7 mmol/L (ref 3.5–5.2)
Sodium: 145 mmol/L — ABNORMAL HIGH (ref 134–144)
eGFR: 103 mL/min/{1.73_m2} (ref >59)

## 2023-07-14 ENCOUNTER — Other Ambulatory Visit (HOSPITAL_COMMUNITY): Payer: Self-pay

## 2023-07-14 ENCOUNTER — Telehealth: Payer: Self-pay

## 2023-07-14 MED ORDER — FLUOXETINE HCL 10 MG PO CAPS: 10.0000 mg | ORAL_CAPSULE | Freq: Every day | ORAL | 1 refills | Status: DC

## 2023-07-14 NOTE — Telephone Encounter (Signed)
 Insurance prefers capsules. Please resend RX for Fluoxetine 10mg  capsules.

## 2023-08-16 ENCOUNTER — Ambulatory Visit: Payer: 59 | Admitting: Family Medicine

## 2023-08-16 VITALS — BP 131/84 | HR 77 | Ht 68.0 in | Wt 212.6 lb

## 2023-08-16 DIAGNOSIS — F4310 Post-traumatic stress disorder, unspecified: Secondary | ICD-10-CM | POA: Diagnosis not present

## 2023-08-16 NOTE — Patient Instructions (Addendum)
It was wonderful to see you today.  Please bring ALL of your medications with you to every visit.   Today we talked about:  I will complete your form in the next few weeks  I will call your son when complete   Bring all of your pills in their bottles to your next visit   Thank you for choosing [facility].   Please call [phone] with any questions about today's appointment.  Please be sure to schedule follow up at the front  desk before you leave today.   Terisa Starr, MD  Family Medicine    Ilikuwa nzuri [place] leo.  Tafadhali leta dawa zako ZOTE kwa kila ziara.   Simonne Come tulizungumza juu ya:  Sherryll Burger fomu Synetta Shadow wiki chache zijazo  Nitampigia simu mwanao ikikamilika   Lete tembe zako zote [place] chupa zao kwa ziara yako inayofuata   Asante kwa kuchagua Dawa ya Familia ya [place].   Edmonia Lynch piga simu [phone] na maswali yoyote kuhusu miadi ya leo.  Tafadhali GNFAOZHYQ umepanga ufuatiliaji kwenye dawati la mbele kabla ya [place] leo.   Terisa Starr, MD  [facility]
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
## 2023-08-18 ENCOUNTER — Telehealth: Payer: Self-pay | Admitting: Family Medicine

## 2023-08-18 NOTE — Telephone Encounter (Signed)
Z610 completed. Reviewed, completed, and signed form.Please make copy for scan box. Please give original wet signatures to patient. Please call patient for pick up   Note routed to RN team inbasket and placed completed form in RN Wall pocket in the front office.  Westley Chandler, MD

## 2023-08-22 ENCOUNTER — Encounter: Payer: Self-pay | Admitting: Student

## 2023-08-22 ENCOUNTER — Ambulatory Visit (INDEPENDENT_AMBULATORY_CARE_PROVIDER_SITE_OTHER): Payer: 59 | Admitting: Student

## 2023-08-22 VITALS — BP 127/90 | HR 73 | Ht 68.0 in | Wt 207.2 lb

## 2023-08-22 DIAGNOSIS — F4310 Post-traumatic stress disorder, unspecified: Secondary | ICD-10-CM

## 2023-08-22 DIAGNOSIS — I10 Essential (primary) hypertension: Secondary | ICD-10-CM | POA: Diagnosis not present

## 2023-08-22 DIAGNOSIS — Z1231 Encounter for screening mammogram for malignant neoplasm of breast: Secondary | ICD-10-CM | POA: Diagnosis not present

## 2023-08-22 MED ORDER — OLMESARTAN-AMLODIPINE-HCTZ 40-10-12.5 MG PO TABS: 1.0000 | ORAL_TABLET | Freq: Every day | ORAL | 0 refills | Status: DC

## 2023-08-22 MED ORDER — OLMESARTAN-AMLODIPINE-HCTZ 40-10-25 MG PO TABS: 1.0000 | ORAL_TABLET | Freq: Every day | ORAL | 1 refills | Status: DC

## 2023-08-22 MED ORDER — FLUOXETINE HCL 20 MG PO TABS: 20.0000 mg | ORAL_TABLET | Freq: Every day | ORAL | 3 refills | Status: DC

## 2023-08-22 NOTE — Progress Notes (Signed)
    SUBJECTIVE:   CHIEF COMPLAINT / HPI:   Hypertension Follow-up Brings in her medications today for me to review. Unfortunately has been taking three ARB-containing medications due to confusion when her medications have been changed in the past:  Amlodipine-olmesartan 10-40mg  Valsartan 80mg   Losartan-hydrochlorothiazide 50-12.5mg   Does not check her BP at home.   PTSD Recently started on Prozac 10mg  daily. Tells me she has noticed a difference here. Feels this is working, still having some of the issues she was having with intrusive thoughts and nightmares but getting better. Feels she still has room to improve.   OBJECTIVE:   BP (!) 127/90   Pulse 73   Ht 5\' 8"  (1.727 m)   Wt 207 lb 3.2 oz (94 kg)   SpO2 98%   BMI 31.50 kg/m   Gen: Well appearing and NAD  HEENT: normocephalic, atraumatic, EOM grossly intact, oral mucosa moist, neck supple Respiratory: normal respiratory effort GI: non-distended Skin: no rashes, no jaundice Psych: appropriate mood and affect    ASSESSMENT/PLAN:   Assessment & Plan Primary hypertension Taking three  ARB-containing meds. BP is actually well-controlled on this combo but she is being over-dosed on ARB agents. To keep the confusion to a minimum, we took all of her meds for destruction today and will send a new prescription combo med for her to pick up. - STOP all current meds - START olmesartan-amlodipine-hydrochlorothiazide 40mg -10mg -12.5mg  daily - BMP today - 2 week follow-up for BP check PTSD (post-traumatic stress disorder) Seeing improvements on Fluoxetine 10mg  daily. Room to improve further. - Increase fluoxetine to 20mg  daily - 2 week f/u as above   Healthcare Maintenance Mammogram ordered. Her children will help her to call and get this appointment set up.   Eliezer Mccoy, MD [facility]

## 2023-08-22 NOTE — Assessment & Plan Note (Signed)
 Taking three  ARB-containing meds. BP is actually well-controlled on this combo but she is being over-dosed on ARB agents. To keep the confusion to a minimum, we took all of her meds for destruction today and will send a new prescription combo med for her to pick up. - STOP all current meds - START olmesartan-amlodipine-hydrochlorothiazide 40mg -10mg -12.5mg  daily - BMP today - 2 week follow-up for BP check

## 2023-08-22 NOTE — Patient Instructions (Addendum)
 I have ordered your mammogram. This will be done at the [facility], right across the street from our clinic. You will need to call to make this appointment. Their number is [phone].  The address is [address], [place], [place] [address]  We are changing your Blood pressure medication. We will see you back in 2 weeks to re-check your blood pressure.   Nimeagiza mammogram yako. Hii itafanywa [place] Kit[place] cha [place], [place] ya barabara kutoka kwa kliniki yetu. [place] simu ili kufanya miadi hii. Nambari yao ni [phone].  Anwani ni [address], [place], [place] [address]  Wilfred Lacy dawa [place] ya shinikizo la damu. [place]akuona baada ya wiki 2 ili kuangalia tena shinikizo la damu yako.  Eliezer Mccoy, MD

## 2023-08-22 NOTE — Telephone Encounter (Signed)
 Patient called and informed that forms are ready for pick up. Copy made and placed in batch scanning. Original placed at front desk for pick up.  Patient has appointment this afternoon. She will pick up paperwork at that time.    Veronda Prude, RN

## 2023-08-22 NOTE — Assessment & Plan Note (Signed)
 Seeing improvements on Fluoxetine 10mg  daily. Room to improve further. - Increase fluoxetine to 20mg  daily - 2 week f/u as above

## 2023-08-23 LAB — BASIC METABOLIC PANEL
BUN/Creatinine Ratio: 19 (ref 9–23)
BUN: 16 mg/dL (ref 6–24)
CO2: 22 mmol/L (ref 20–29)
Calcium: 9.5 mg/dL (ref 8.7–10.2)
Chloride: 104 mmol/L (ref 96–106)
Creatinine, Ser: 0.84 mg/dL (ref 0.57–1.00)
Glucose: 91 mg/dL (ref 70–99)
Potassium: 4 mmol/L (ref 3.5–5.2)
Sodium: 140 mmol/L (ref 134–144)
eGFR: 83 mL/min/{1.73_m2} (ref >59)

## 2023-09-05 ENCOUNTER — Ambulatory Visit: Payer: Self-pay | Admitting: Family Medicine

## 2023-09-13 ENCOUNTER — Ambulatory Visit: Payer: 59

## 2023-10-13 ENCOUNTER — Encounter: Payer: Self-pay | Admitting: Family Medicine

## 2023-10-13 ENCOUNTER — Ambulatory Visit: Admitting: Family Medicine

## 2023-10-13 VITALS — BP 138/93 | HR 61 | Ht 68.0 in | Wt 213.6 lb

## 2023-10-13 DIAGNOSIS — L659 Nonscarring hair loss, unspecified: Secondary | ICD-10-CM | POA: Diagnosis not present

## 2023-10-13 DIAGNOSIS — I10 Essential (primary) hypertension: Secondary | ICD-10-CM

## 2023-10-13 DIAGNOSIS — F4310 Post-traumatic stress disorder, unspecified: Secondary | ICD-10-CM

## 2023-10-13 NOTE — Progress Notes (Unsigned)
    SUBJECTIVE:   CHIEF COMPLAINT / HPI:   ES is a 53yo F w/ hx of HTN, prediabetes, PTSD that p/f BP f/u.   HTN - saw PCP 2/17, was switched to combo olmesartan-amlodipine-hydrochlorothiazide 40mg -10mg -12.5mg  daily to minimize confusion/pill burden.  - Reports she is now only taking 1 pill for blood pressure (presumably this is the combo pill), and reports she has since run out of her other meds. She is unable to tell me the name.   PTSD - Fluoxetine was also increased at 2/17 visit to 20mg  daily.  - Pt is unsure what dose she is on currently - Pt is only taking a single pill a day (presumably this is either her BP med OR her fluxetine, but she is not taking both).  Hair  - Reports head on crown of head has been growing slower for past year - The area of her scalp is itchy and she scratches.  - Dad is bald, but mom still has hair  OBJECTIVE:   There were no vitals taken for this visit.     Thinning of hair at top/crown of head in female No underlying erythema, scaling, or evidence of broken hair follicles.   ASSESSMENT/PLAN:   Assessment & Plan      Lincoln Brigham, MD [facility]

## 2023-10-13 NOTE — Patient Instructions (Addendum)
 Nimefurahi [place] leo - Asante kwa [place]  Mambo tuliyojadili leo:  1) Unapaswa kuchukua dawa 2 kila siku. Kidonge kimoja kinaitwa olmesartan-amlodipine-hydrochlorothiazide Kidonge kimoja kinaitwa fluoxetine  2) Rudi tarehe 10/18/23 saa 9:20 asubuhi ili kuangalia tena shinikizo la damu yako. Tafadhali leta chupa zako zote za vidonge kwenye ziara hii ili tuweze [place] ni dawa gani unatumia.    Good to see you today - Thank you for coming in  Things we discussed today:  1) You should be taking 2 medications daily. One pill is called olmesartan-amlodipine-hydrochlorothiazide One pill is called fluoxetine  2) Come back on 10/18/23 at 9:20am to recheck your blood pressure. Please bring all of your pill bottles to this visit so we can check which medications you are taking.

## 2023-10-14 NOTE — Assessment & Plan Note (Signed)
 Uncontrolled. Unclear if the patient is not taking her combo blood pressure pill or her fluoxetine, but she is only taking one of them.  Counseled patient that she should be on 2 pills daily, 1 for blood pressure and 1 for mood. -Continue amlodipine-olmesartan-hydrochlorothiazide -Follow-up with Dr. Raymondo Band in 1 week to review med adherence

## 2023-10-14 NOTE — Assessment & Plan Note (Signed)
 Continue fluoxetine

## 2023-10-18 ENCOUNTER — Emergency Department (HOSPITAL_COMMUNITY): Admission: EM | Admit: 2023-10-18 | Source: Home / Self Care

## 2023-10-18 ENCOUNTER — Ambulatory Visit: Admitting: Pharmacist

## 2023-11-15 ENCOUNTER — Ambulatory Visit

## 2023-11-15 DIAGNOSIS — I10 Essential (primary) hypertension: Secondary | ICD-10-CM

## 2024-02-08 ENCOUNTER — Ambulatory Visit: Payer: Self-pay | Admitting: Family Medicine

## 2024-02-08 ENCOUNTER — Ambulatory Visit: Admitting: Family Medicine

## 2024-02-08 VITALS — BP 127/88 | HR 72 | Wt 209.4 lb

## 2024-02-08 DIAGNOSIS — J069 Acute upper respiratory infection, unspecified: Secondary | ICD-10-CM | POA: Diagnosis not present

## 2024-02-08 DIAGNOSIS — F4310 Post-traumatic stress disorder, unspecified: Secondary | ICD-10-CM

## 2024-02-08 DIAGNOSIS — I10 Essential (primary) hypertension: Secondary | ICD-10-CM | POA: Diagnosis not present

## 2024-02-08 DIAGNOSIS — K5900 Constipation, unspecified: Secondary | ICD-10-CM

## 2024-02-08 DIAGNOSIS — R7303 Prediabetes: Secondary | ICD-10-CM

## 2024-02-08 DIAGNOSIS — Z1231 Encounter for screening mammogram for malignant neoplasm of breast: Secondary | ICD-10-CM

## 2024-02-08 DIAGNOSIS — Z0000 Encounter for general adult medical examination without abnormal findings: Secondary | ICD-10-CM | POA: Diagnosis not present

## 2024-02-08 LAB — POCT GLYCOSYLATED HEMOGLOBIN (HGB A1C): Hemoglobin A1C: 5.8 % — AB (ref 4.0–5.6)

## 2024-02-08 LAB — POC SOFIA SARS ANTIGEN FIA: SARS Coronavirus 2 Ag: NEGATIVE

## 2024-02-08 MED ORDER — FLUTICASONE PROPIONATE 50 MCG/ACT NA SUSP
2.0000 | Freq: Every day | NASAL | 6 refills | Status: DC
Start: 2024-02-08 — End: 2024-02-08

## 2024-02-08 MED ORDER — POLYETHYLENE GLYCOL 3350 17 GM/SCOOP PO POWD: 17.0000 g | Freq: Two times a day (BID) | ORAL | 5 refills | Status: DC

## 2024-02-08 MED ORDER — FLUTICASONE PROPIONATE 50 MCG/ACT NA SUSP: 2.0000 | Freq: Every day | NASAL | 6 refills | Status: AC

## 2024-02-08 MED ORDER — POLYETHYLENE GLYCOL 3350 17 GM/SCOOP PO POWD
17.0000 g | Freq: Two times a day (BID) | ORAL | 5 refills | Status: AC
Start: 2024-02-08 — End: ?

## 2024-02-08 MED ORDER — OLMESARTAN-AMLODIPINE-HCTZ 40-10-12.5 MG PO TABS: 1.0000 | ORAL_TABLET | Freq: Every day | ORAL | 0 refills | Status: DC

## 2024-02-08 MED ORDER — FLUOXETINE HCL 20 MG PO TABS: 20.0000 mg | ORAL_TABLET | Freq: Every day | ORAL | 3 refills | Status: DC

## 2024-02-08 MED ORDER — FLUOXETINE HCL 20 MG PO TABS: 20.0000 mg | ORAL_TABLET | Freq: Every day | ORAL | 3 refills | Status: AC

## 2024-02-08 NOTE — Assessment & Plan Note (Signed)
 Mammogram ordered

## 2024-02-08 NOTE — Assessment & Plan Note (Signed)
 A1c updated today; previous was 6.2.

## 2024-02-08 NOTE — Assessment & Plan Note (Signed)
 Refilled miralax

## 2024-02-08 NOTE — Progress Notes (Signed)
    SUBJECTIVE:   CHIEF COMPLAINT / HPI:   Sick symptoms She has been having a sore throat and cough for about a week. She is having some sputum. No sick contacts. She has felt hot though without true fever. No nausea or vomiting. She also has had a headache this morning for which she took OTC pain medicine, and this helped.  HTN Has been taking her olmesartan -amlodipine -hydrochlorothiazide  as directed without issues.  PERTINENT  PMH / PSH: PTSD, prediabetes  OBJECTIVE:   BP 127/88   Pulse 72   Wt 209 lb 6.4 oz (95 kg)   SpO2 100%   BMI 31.84 kg/m   General: Alert and oriented, in NAD Skin: Warm, dry, and intact without lesions HEENT: NCAT, EOM grossly normal, midline nasal septum Cardiac: RRR, no m/r/g appreciated Respiratory: CTAB, breathing and speaking comfortably on RA Abdominal: Soft, nontender, nondistended, normoactive bowel sounds Extremities: Moves all extremities grossly equally Neurological: No gross focal deficit Psychiatric: Appropriate mood and affect   ASSESSMENT/PLAN:   Assessment & Plan Viral URI with cough History and exam most concerning for viral process.  COVID swab negative. Reassuringly no focal findings on exam.  Recommended conservative management with continued OTC meds including Tylenol/ibuprofen as well as honey and warm tea. Also refilled flonase .  Discussed returning to care should she have fever, chest pain, increasing shortness of breath, or otherwise is not improving.  Primary hypertension Controlled on current regimen. Refilled medication. PTSD (post-traumatic stress disorder) Refilled prozac . Constipation, unspecified constipation type Refilled miralax .  Prediabetes A1c updated today; previous was 6.2. Healthcare maintenance Mammogram ordered.   Stuart Redo, MD [facility]

## 2024-02-08 NOTE — Patient Instructions (Addendum)
 You have a viral infection. Continue tylenol and ibuprofen as needed. Continue flonase . Come back if you have chest pain, shortness of breath, fevers, or are not improving.  Take all your other medications.  Call to make an appointment and go get your mammogram at: [address] [place],  [place]  [address] Main: [phone]

## 2024-02-08 NOTE — Assessment & Plan Note (Signed)
Refilled prozac.

## 2024-02-08 NOTE — Assessment & Plan Note (Signed)
 Controlled on current regimen. Refilled medication.

## 2024-03-09 ENCOUNTER — Other Ambulatory Visit: Payer: Self-pay | Admitting: Family Medicine

## 2024-03-09 DIAGNOSIS — I10 Essential (primary) hypertension: Secondary | ICD-10-CM

## 2024-05-24 ENCOUNTER — Ambulatory Visit: Admitting: Family Medicine

## 2024-05-30 ENCOUNTER — Ambulatory Visit: Admitting: Family Medicine

## 2024-05-30 ENCOUNTER — Ambulatory Visit: Payer: Self-pay | Admitting: Family Medicine

## 2024-05-30 VITALS — BP 155/103 | HR 64 | Wt 210.2 lb

## 2024-05-30 DIAGNOSIS — I10 Essential (primary) hypertension: Secondary | ICD-10-CM | POA: Diagnosis not present

## 2024-05-30 DIAGNOSIS — Z23 Encounter for immunization: Secondary | ICD-10-CM

## 2024-05-30 DIAGNOSIS — R7303 Prediabetes: Secondary | ICD-10-CM | POA: Diagnosis not present

## 2024-05-30 DIAGNOSIS — Z1231 Encounter for screening mammogram for malignant neoplasm of breast: Secondary | ICD-10-CM | POA: Diagnosis not present

## 2024-05-30 DIAGNOSIS — F4310 Post-traumatic stress disorder, unspecified: Secondary | ICD-10-CM

## 2024-05-30 DIAGNOSIS — G4700 Insomnia, unspecified: Secondary | ICD-10-CM | POA: Diagnosis not present

## 2024-05-30 DIAGNOSIS — M899 Disorder of bone, unspecified: Secondary | ICD-10-CM

## 2024-05-30 LAB — POCT GLYCOSYLATED HEMOGLOBIN (HGB A1C): HbA1c, POC (prediabetic range): 5.7 % (ref 5.7–6.4)

## 2024-05-30 MED ORDER — MELATONIN 3 MG PO TABS: 3.0000 mg | ORAL_TABLET | Freq: Every day | ORAL | 0 refills | Status: AC

## 2024-05-30 MED ORDER — OLMESARTAN-AMLODIPINE-HCTZ 40-10-12.5 MG PO TABS: 1.0000 | ORAL_TABLET | Freq: Every day | ORAL | 3 refills | Status: AC

## 2024-05-30 NOTE — Assessment & Plan Note (Signed)
 Recurring issue for her. Trial melatonin. Does not appear to be mental health related given stability above.

## 2024-05-30 NOTE — Progress Notes (Signed)
    SUBJECTIVE:   CHIEF COMPLAINT / HPI:   HTN Has been out of her medications for a week and a half now.  PTSD Has not been taking Prozac . Does not feel sad or upset.  Insomnia Has not been able to sleep well for 5 days. She feels like she never sleeps at all. She does sleep some, however, she just does not feel restored. She just stares at the wall at night. Is not worried and does not have anxious thoughts at night.  Left upper chest bump Noticed it 2 weeks ago. Has been more sore recently. No redness or swelling. No other areas of concern.  PERTINENT  PMH / PSH: prediabetes  OBJECTIVE:   BP (!) 155/103   Pulse 64   Wt 210 lb 3.2 oz (95.3 kg)   SpO2 100%   BMI 31.96 kg/m   General: Alert and oriented, in NAD Skin: Warm, dry, and intact without lesions HEENT: NCAT, EOM grossly normal, midline nasal septum Cardiac: RRR, no m/r/g appreciated Respiratory: CTAB, breathing and speaking comfortably on RA Abdominal: Soft, nontender, nondistended, normoactive bowel sounds Extremities: Moves all extremities grossly equally, bony enlargement of what feels like L medial clavicle without significant TTP or overlying erythema Neurological: No gross focal deficit Psychiatric: Appropriate mood and affect   ASSESSMENT/PLAN:   Assessment & Plan Primary hypertension Previously controlled on olmesartan -amlodipine -hydrochlorothiazide . Asymptomatic today. Refilled. PTSD (post-traumatic stress disorder) Stable off Prozac . No SI. Will not refill at this time given she is not taking this. Continue to follow at other visits. Prediabetes Update A1c today. Bone lesion Feels like enlargement of L medial clavicle. Rapid increase is concerning. Patient otherwise feels well. Will proceed with XR for further characterization. She was given the address. Encounter for screening mammogram for malignant neoplasm of breast Mammogram ordered, and she was given the number to call and  schedule. Insomnia, unspecified type Recurring issue for her. Trial melatonin. Does not appear to be mental health related given stability above.  Stuart Redo, MD [facility]

## 2024-05-30 NOTE — Assessment & Plan Note (Signed)
Update A1c today 

## 2024-05-30 NOTE — Patient Instructions (Addendum)
 I have sent in melatonin for your sleep.  I have refilled your blood pressure medicine.  You will need to go get your x-rays done anytime you can at: Address: [address] [place], [place], [place] [address] Phone: [phone]  You will need to call and make an appointment for your breast cancer screening at: [phone].  We collected blood sugar readings today.  Nimetuma melatonin kwa usingizi wako.  Nimejaza tena dawa yako ya shinikizo la damu.  [place] mingo limb x-rays yako wakati wowote unaweza kwa: Anwani: [address] [place], [place], [place] [address] Simu: [phone]  [place] dorman simu na kupanga miadi ya uchunguzi wako wa saratani ya matiti kwa: [phone].  Tulikusanya usomaji wa sukari ya damu leo.

## 2024-05-30 NOTE — Assessment & Plan Note (Signed)
 Previously controlled on olmesartan -amlodipine -hydrochlorothiazide . Asymptomatic today. Refilled.

## 2024-05-30 NOTE — Assessment & Plan Note (Addendum)
 Stable off Prozac . No SI. Will not refill at this time given she is not taking this. Continue to follow at other visits.

## 2024-06-29 ENCOUNTER — Ambulatory Visit
Admission: RE | Admit: 2024-06-29 | Discharge: 2024-06-29 | Disposition: A | Discharge status: Home / Self Care | Source: Ambulatory Visit | Attending: Family Medicine | Admitting: Family Medicine

## 2024-06-29 DIAGNOSIS — Z1231 Encounter for screening mammogram for malignant neoplasm of breast: Secondary | ICD-10-CM

## 2024-07-06 ENCOUNTER — Other Ambulatory Visit: Payer: Self-pay | Admitting: Family Medicine

## 2024-07-06 DIAGNOSIS — R928 Other abnormal and inconclusive findings on diagnostic imaging of breast: Secondary | ICD-10-CM

## 2024-07-12 ENCOUNTER — Other Ambulatory Visit: Payer: Self-pay | Admitting: Family Medicine

## 2024-07-12 DIAGNOSIS — R928 Other abnormal and inconclusive findings on diagnostic imaging of breast: Secondary | ICD-10-CM

## 2024-08-01 ENCOUNTER — Ambulatory Visit: Payer: Self-pay | Admitting: Student

## 2024-08-01 ENCOUNTER — Encounter: Payer: Self-pay | Admitting: Student

## 2024-08-01 VITALS — BP 135/97 | HR 69 | Ht 68.0 in | Wt 207.0 lb

## 2024-08-01 DIAGNOSIS — M25521 Pain in right elbow: Secondary | ICD-10-CM | POA: Diagnosis not present

## 2024-08-01 MED ORDER — NAPROXEN 500 MG PO TABS: 500.0000 mg | ORAL_TABLET | Freq: Two times a day (BID) | ORAL | 0 refills | Status: AC

## 2024-08-01 NOTE — Patient Instructions (Addendum)
 It was great to see you today!   I have sent a new medication to your pharmacy for pain. Do not use this medication with ibuprofen. Tylenol is safe to use.   Ilikuwa nzuri kukuona leo!   Nimetuma dawa mpya kwenye duka lako la dawa kwa ajili ya maumivu. Usitumie dawa hii na ibuprofen. Tylenol ni salama kutumia.  I have ordered an xray of your elbow you can get this across [address] at the following address:  [facility] [address]  [phone] You do not need an appointment.    Nimeagiza xray ya kiwiko chako unaweza kupata hii kote [address] kwa anwani ifuatayo:  Blane champion wa [facility] [place] [address]  [phone] [place] miadi.  For your pain you can try several over the counter topical medications. Topical medications are a great option to treat pain as you can place the medication right where you are hurting and they have less side effects.   Kwa maumivu yako unaweza kujaribu dawa kadhaa za juu. Dawa za topical ni chaguo kubwa la kutibu maumivu kwani unaweza kuweka dawa pale unapoumia na zina madhara kidogo.  Voltaren  Gel/Diclofenac  Gel: this is an anti-inflammatory cream (same ingredient as Motrin/Ibuprofen/Aleve ) can be applied up to 4 times per day.     Lidocaine patch: can be applied for 12 hours and after needs a 12 hour break to continue to be effective. Works by using numbing medication to help with pain relief. Can be found at any pharmacy over the counter or on [email or web address]    Capsaicin cream: made from the same ingredient that makes hot peppers spicy, this works by numbing up the area of pain. Please wash your hands and do not touch your eyes after applying the cream as active ingredient can hurt your eyes.

## 2024-08-01 NOTE — Progress Notes (Signed)
" ° ° °  SUBJECTIVE:   CHIEF COMPLAINT / HPI:   Monica Li is a 54 y.o. female presenting for right elbow pain and L middle finger pain.  Patient's family member was in attendance and assisted with HPI. An iPad interpreter was used for the visit.    She has had constant pain on the inner aspect of the right arm since June 13, 2024, without known injury. Ibuprofen, topical treatments, and warm water massages have not helped. She works in a acupuncturist with frequent arm use. The pain started suddenly after years in this job.  She has had persistent pain in the left finger for 2 weeks with no known injury and no weakness. Over-the-counter medications and topical treatments have not helped.  PERTINENT  PMH / PSH: reviewed and updated.  OBJECTIVE:   BP (!) 135/97   Pulse 69   Ht 5' 8 (1.727 m)   Wt 207 lb (93.9 kg)   SpO2 96%   BMI 31.47 kg/m   Well-appearing, no acute distress Cardio: Regular rate, regular rhythm, no murmurs on exam. Pulm: Clear, no wheezing, no crackles. No increased work of breathing Abdominal: bowel sounds present, soft, non-tender, non-distended MSK:  No obvious deformity over the right arm or L middle finger Tenderness to palpation over the Lateral epicondyle of the right arm  ROM intact for R elbow and L middle finger   Tenderness to palpation for the PIP joint in the middle finger with mild edema   ASSESSMENT/PLAN:   Assessment & Plan Right elbow pain Exam consistent with lateral epicondylitis but will rule out fracture with DG elbow.  Prescribed Naproxen  500 mg BID for pain, cautioned on other NSAID use and GI side effects  Discussed topical modalities for pain control for the finger Advised rest however this will be difficult due to the physical demands of her job.      Damien Pinal, DO [facility]  "
